# Patient Record
Sex: Female | Born: 1965
Health system: Southern US, Community
[De-identification: ages and names within clinical notes are randomized; demographics above are authoritative.]

## PROBLEM LIST (undated history)

## (undated) DIAGNOSIS — E785 Hyperlipidemia, unspecified: Secondary | ICD-10-CM

## (undated) DIAGNOSIS — E119 Type 2 diabetes mellitus without complications: Secondary | ICD-10-CM

## (undated) HISTORY — DX: Type 2 diabetes mellitus without complications: E11.9

## (undated) HISTORY — DX: Hyperlipidemia, unspecified: E78.5

---

## 1998-04-04 ENCOUNTER — Ambulatory Visit (HOSPITAL_COMMUNITY): Admission: RE | Admit: 1998-04-04 | Discharge: 1998-04-04 | Payer: Self-pay | Admitting: Obstetrics

## 1998-04-04 ENCOUNTER — Other Ambulatory Visit: Admission: RE | Admit: 1998-04-04 | Discharge: 1998-04-04 | Payer: Self-pay | Admitting: Obstetrics

## 1998-05-21 ENCOUNTER — Ambulatory Visit (HOSPITAL_COMMUNITY): Admission: RE | Admit: 1998-05-21 | Discharge: 1998-05-21 | Payer: Self-pay | Admitting: Obstetrics

## 1998-08-24 ENCOUNTER — Inpatient Hospital Stay (HOSPITAL_COMMUNITY): Admission: AD | Admit: 1998-08-24 | Discharge: 1998-08-26 | Payer: Self-pay | Admitting: *Deleted

## 1999-08-30 ENCOUNTER — Encounter: Payer: Self-pay | Admitting: General Surgery

## 1999-08-30 ENCOUNTER — Inpatient Hospital Stay (HOSPITAL_COMMUNITY): Admission: EM | Admit: 1999-08-30 | Discharge: 1999-08-31 | Payer: Self-pay

## 1999-08-30 ENCOUNTER — Encounter: Payer: Self-pay | Admitting: Gastroenterology

## 2017-03-26 DIAGNOSIS — H40003 Preglaucoma, unspecified, bilateral: Secondary | ICD-10-CM | POA: Insufficient documentation

## 2017-03-26 DIAGNOSIS — Q141 Congenital malformation of retina: Secondary | ICD-10-CM | POA: Insufficient documentation

## 2017-08-19 ENCOUNTER — Ambulatory Visit: Payer: Self-pay | Admitting: Family Medicine

## 2017-09-08 ENCOUNTER — Encounter: Payer: Self-pay | Admitting: Family Medicine

## 2017-09-08 ENCOUNTER — Ambulatory Visit (INDEPENDENT_AMBULATORY_CARE_PROVIDER_SITE_OTHER): Payer: Medicare Other | Admitting: Family Medicine

## 2017-09-08 VITALS — BP 122/87 | HR 107 | Temp 98.6°F | Ht 65.04 in | Wt 185.2 lb

## 2017-09-08 DIAGNOSIS — Z0000 Encounter for general adult medical examination without abnormal findings: Secondary | ICD-10-CM | POA: Insufficient documentation

## 2017-09-08 DIAGNOSIS — I10 Essential (primary) hypertension: Secondary | ICD-10-CM | POA: Diagnosis not present

## 2017-09-08 DIAGNOSIS — E119 Type 2 diabetes mellitus without complications: Secondary | ICD-10-CM | POA: Diagnosis not present

## 2017-09-08 DIAGNOSIS — L84 Corns and callosities: Secondary | ICD-10-CM | POA: Insufficient documentation

## 2017-09-08 LAB — POCT GLYCOSYLATED HEMOGLOBIN (HGB A1C): Hemoglobin A1C: 12.4 % — AB (ref 4.0–5.6)

## 2017-09-08 NOTE — Progress Notes (Signed)
Subjective: No chief complaint on file.    HPI: Linda Livingston is a 52 y.o. presenting to clinic today to discuss the following:  Establish Care Patient states she heard about our clinic from her sister who also goes here. She states she has not been to see a doctor in "I don't know how long" and has not had routine medical care "in many years". She states she was very rarely sick so did not seek medical help b/c she didn't feel bad enough to go anywhere to get any. She was last hospitalized over 10 years ago for "gallstones" although she states she did not have surgery.  Her only complaint is that she has a callous on the bottom of her left foot that hurts her sometimes when she walks.  She denies any changes in vision, dizziness, fever, chills, chest pain, SOB, nausea, vomiting, abdominal pain, changes in appetite, blood in the urine or stool.  She has no known medical problems, has not had any previous surgeries, takes no medications prescription or OTC, and has no known allergies.  Health Maintenance: HIV screen, discussed colonoscopy, pap smear, and mammogram. Will schedule at next visit as patient did not want to commit to doing them today.     ROS noted in HPI.   Past Medical, Surgical, Social, and Family History Reviewed & Updated per EMR.   Pertinent Historical Findings include:   Social History   Tobacco Use  Smoking Status Not on file   Objective: BP 122/87 (BP Location: Left Arm, Patient Position: Sitting, Cuff Size: Normal)   Pulse (!) 107   Temp 98.6 F (37 C) (Oral)   Ht 5' 5.04" (1.652 m)   Wt 185 lb 3.2 oz (84 kg)   SpO2 98%   BMI 30.78 kg/m  Vitals and nursing notes reviewed  Physical Exam Gen: Alert and Oriented x 3, NAD HEENT: Normocephalic, atraumatic, PERRLA, EOMI, TM visible with good light reflex, non-swollen, non-erythematous turbinates, non-erythematous pharyngeal mucosa, no exudates Neck: trachea midline, no thyroidmegaly, no LAD CV:  RRR, no murmurs, normal S1, S2 split, +2 pulses dorsalis pedis bilaterally Resp: CTAB, no wheezing, rales, or rhonchi, comfortable work of breathing Abd: obese, non-distended, non-tender, soft, +bs in all four quadrants, no hepatosplenomegaly MSK: Moves in all four extremities Ext: no clubbing, cyanosis, or edema Neuro: CN II-XII grossly intact, LE reflexes  Skin: warm, dry, intact, no rashes Psych: appropriate behavior, mood  Results for orders placed or performed in visit on 09/08/17 (from the past 72 hour(s))  HgB A1c     Status: Abnormal   Collection Time: 09/08/17  2:43 PM  Result Value Ref Range   Hemoglobin A1C 12.4 (A) 4.0 - 5.6 %   HbA1c POC (<> result, manual entry)  4.0 - 5.6 %   HbA1c, POC (prediabetic range)  5.7 - 6.4 %   HbA1c, POC (controlled diabetic range)  0.0 - 7.0 %    Assessment/Plan:  Health maintenance examination Patient has no systemic complaints or significant past medical history.   Will obtain routine blood work for maintenance: lipid panel, cbc, cmp, and HgbA1c to screen for hyperlipidemia, diabetes, anemia   Pre-ulcerative corn or callous Intact on bottom of left foot. Advised to wear shoes with good support and soak feet in warm soapy water and keep clean.   PATIENT EDUCATION PROVIDED: See AVS    Diagnosis and plan along with any newly prescribed medication(s) were discussed in detail with this patient today. The patient  verbalized understanding and agreed with the plan. Patient advised if symptoms worsen return to clinic or ER.   Health Maintainance:   Orders Placed This Encounter  Procedures  . CBC  . Comprehensive metabolic panel  . Lipid Panel  . HIV antibody (with reflex)  . HgB A1c    No orders of the defined types were placed in this encounter.    Harolyn Rutherford, DO 09/08/2017, 1:47 PM PGY-1, [place]

## 2017-09-08 NOTE — Assessment & Plan Note (Addendum)
Patient has no systemic complaints or significant past medical history.   Will obtain routine blood work for maintenance: lipid panel, cbc, cmp, and HgbA1c to screen for hyperlipidemia, diabetes, anemia

## 2017-09-08 NOTE — Assessment & Plan Note (Signed)
Intact on bottom of left foot. Advised to wear shoes with good support and soak feet in warm soapy water and keep clean.

## 2017-09-08 NOTE — Patient Instructions (Signed)
It was great to meet you today! Thank you for letting me participate in your care!  Today, we discussed your past medical history and your current health status. I am getting some routine lab/blood work on you today and will inform you of the results.   If everything is normal I will have you come back in 6 months. If it is not normal I will have you return sooner.  Please continue to eat a balanced diet and exercise.  Be well, Harolyn Rutherford, DO PGY-1, [facility]

## 2017-09-09 LAB — LIPID PANEL
Chol/HDL Ratio: 3.2 ratio (ref 0.0–4.4)
Cholesterol, Total: 201 mg/dL — ABNORMAL HIGH (ref 100–199)
HDL: 62 mg/dL (ref >39)
LDL Calculated: 121 mg/dL — ABNORMAL HIGH (ref 0–99)
Triglycerides: 90 mg/dL (ref 0–149)
VLDL Cholesterol Cal: 18 mg/dL (ref 5–40)

## 2017-09-09 LAB — COMPREHENSIVE METABOLIC PANEL
ALT: 11 IU/L (ref 0–32)
AST: 10 IU/L (ref 0–40)
Albumin/Globulin Ratio: 1.4 (ref 1.2–2.2)
Albumin: 3.9 g/dL (ref 3.5–5.5)
Alkaline Phosphatase: 100 IU/L (ref 39–117)
BUN/Creatinine Ratio: 13 (ref 9–23)
BUN: 10 mg/dL (ref 6–24)
Bilirubin Total: 0.3 mg/dL (ref 0.0–1.2)
CO2: 25 mmol/L (ref 20–29)
Calcium: 9.4 mg/dL (ref 8.7–10.2)
Chloride: 97 mmol/L (ref 96–106)
Creatinine, Ser: 0.78 mg/dL (ref 0.57–1.00)
GFR calc Af Amer: 101 mL/min/{1.73_m2} (ref >59)
GFR calc non Af Amer: 88 mL/min/{1.73_m2} (ref >59)
Globulin, Total: 2.8 g/dL (ref 1.5–4.5)
Glucose: 394 mg/dL — ABNORMAL HIGH (ref 65–99)
Potassium: 4.8 mmol/L (ref 3.5–5.2)
Sodium: 135 mmol/L (ref 134–144)
Total Protein: 6.7 g/dL (ref 6.0–8.5)

## 2017-09-09 LAB — CBC
Hematocrit: 43.2 % (ref 34.0–46.6)
Hemoglobin: 14.5 g/dL (ref 11.1–15.9)
MCH: 31.3 pg (ref 26.6–33.0)
MCHC: 33.6 g/dL (ref 31.5–35.7)
MCV: 93 fL (ref 79–97)
Platelets: 274 10*3/uL (ref 150–450)
RBC: 4.64 x10E6/uL (ref 3.77–5.28)
RDW: 13.8 % (ref 12.3–15.4)
WBC: 6.5 10*3/uL (ref 3.4–10.8)

## 2017-09-09 LAB — HIV ANTIBODY (ROUTINE TESTING W REFLEX): HIV Screen 4th Generation wRfx: NONREACTIVE

## 2017-09-30 ENCOUNTER — Ambulatory Visit (INDEPENDENT_AMBULATORY_CARE_PROVIDER_SITE_OTHER): Payer: Medicare Other | Admitting: Family Medicine

## 2017-09-30 ENCOUNTER — Encounter: Payer: Self-pay | Admitting: Family Medicine

## 2017-09-30 ENCOUNTER — Other Ambulatory Visit: Payer: Self-pay

## 2017-09-30 VITALS — BP 150/78 | HR 109 | Temp 98.7°F | Ht 65.0 in | Wt 186.0 lb

## 2017-09-30 DIAGNOSIS — E119 Type 2 diabetes mellitus without complications: Secondary | ICD-10-CM | POA: Diagnosis not present

## 2017-09-30 DIAGNOSIS — E7841 Elevated Lipoprotein(a): Secondary | ICD-10-CM | POA: Diagnosis not present

## 2017-09-30 DIAGNOSIS — R899 Unspecified abnormal finding in specimens from other organs, systems and tissues: Secondary | ICD-10-CM

## 2017-09-30 LAB — GLUCOSE, POCT (MANUAL RESULT ENTRY): POC Glucose: 334 mg/dl — AB (ref 70–99)

## 2017-09-30 MED ORDER — GLUCOSE BLOOD VI STRP: ORAL_STRIP | 12 refills | Status: DC

## 2017-09-30 MED ORDER — ONETOUCH DELICA LANCING DEV MISC: 1.0000 [IU] | Freq: Every day | 1 refills | Status: DC

## 2017-09-30 MED ORDER — PRAVASTATIN SODIUM 20 MG PO TABS: 20.0000 mg | ORAL_TABLET | Freq: Every day | ORAL | 3 refills | Status: DC

## 2017-09-30 MED ORDER — METFORMIN HCL 500 MG PO TABS
500.0000 mg | ORAL_TABLET | Freq: Two times a day (BID) | ORAL | 3 refills | Status: DC
Start: 2017-09-30 — End: 2017-12-02

## 2017-09-30 MED ORDER — ONETOUCH VERIO W/DEVICE KIT: 1.0000 [IU] | PACK | Freq: Every day | 0 refills | Status: DC

## 2017-09-30 MED ORDER — INSULIN GLARGINE 100 UNIT/ML SOLOSTAR PEN: 10.0000 [IU] | PEN_INJECTOR | Freq: Every day | SUBCUTANEOUS | 11 refills | Status: DC

## 2017-09-30 MED ORDER — ONETOUCH DELICA LANCETS 33G MISC: 1.0000 [IU] | Freq: Three times a day (TID) | 11 refills | Status: DC

## 2017-09-30 NOTE — Progress Notes (Signed)
Subjective: Chief Complaint  Patient presents with  . Diabetes    HPI: Linda Livingston is a 52 y.o. presenting to clinic today to discuss the following:  DM Follow up Patient is a newly diagnosed diabetic. We discussed her new diagnosis in depth and multiple options in dealing with her new diagnosis. She understands the health risks associated with diabetes as we talked about the reasons why it is important to control blood glucose. Patient will make diet modification and greatly reduce her intake of sweets and foods high in sugar. She will eliminate sodas from her diet. Patient will continue to exercise 30 minutes per day 5 times per week.  She denies any polydypsia, polyuria, polyphagia. She denies any chest pain, difficulty breathing, abdominal pain, nausea, vomiting, diarrhea, or blood in stool or urine.  HLD Patient has elevated Cholesterol and LDL from previous lab work done at last visit. Will start on statin.  Health Maintenance: none today     ROS noted in HPI.   Past Medical, Surgical, Social, and Family History Reviewed & Updated per EMR.   Pertinent Historical Findings include:   Social History   Tobacco Use  Smoking Status Never Smoker  Smokeless Tobacco Never Used    Objective: BP (!) 150/78   Pulse (!) 109   Temp 98.7 F (37.1 C) (Oral)   Ht _0  (1.651 m)   Wt 186 lb (84.4 kg)   SpO2 97%   BMI 30.95 kg/m  Vitals and nursing notes reviewed  Physical Exam Gen: Alert and Oriented x 3, NAD HEENT: Normocephalic, atraumatic, PERRLA, EOMI Neck: trachea midline, no thyroidmegaly, no LAD CV: RRR, no murmurs, normal S1, S2 split, +2 pulses dorsalis pedis bilaterally Resp: CTAB, no wheezing, rales, or rhonchi, comfortable work of breathing Abd: non-distended, non-tender, soft, +bs in all four quadrants, no hepatosplenomegaly MSK: FROM in all four extremities Ext: no clubbing, cyanosis, or edema Skin: warm, dry, intact, no rashes Psych: appropriate  behavior, mood  No results found for this or any previous visit (from the past 72 hour(s)).  Assessment/Plan:  Elevated lipoprotein(a) Started on Pravastatin 64m. Will recheck in 6 months and also recheck LFTs at that time.  Type 2 diabetes mellitus without complication, without long-term current use of insulin ([place] Patient will attempt to get control with diet and exercise for the next 2 months. Started Metformin 5014mBID for now. Will titrate up at next visit. If her A1c remains elevated with little improvement I will add a second agent at her next visit; GLP-1 or a SGLT-2. If she remains elevated after addition of a second oral agent I will start her on insulin.   Patient was given multiple options and agreed with this plan.   PATIENT EDUCATION PROVIDED: See AVS    Diagnosis and plan along with any newly prescribed medication(s) were discussed in detail with this patient today. The patient verbalized understanding and agreed with the plan. Patient advised if symptoms worsen return to clinic or ER.   Health Maintainance:   Orders Placed This Encounter  Procedures  . Glucose (CBG)    Meds ordered this encounter  Medications  . DISCONTD: Insulin Glargine (LANTUS) 100 UNIT/ML Solostar Pen    Sig: Inject 10 Units into the skin at bedtime.    Dispense:  15 mL    Refill:  11  . metFORMIN (GLUCOPHAGE) 500 MG tablet    Sig: Take 1 tablet (500 mg total) by mouth 2 (two) times daily with  a meal.    Dispense:  180 tablet    Refill:  3  . glucose blood (ONETOUCH VERIO) test strip    Sig: Use as instructed    Dispense:  100 each    Refill:  12  . ONETOUCH DELICA LANCETS 33O MISC    Sig: 1 Units by Does not apply route 4 (four) times daily -  before meals and at bedtime.    Dispense:  120 each    Refill:  11  . Lancet Devices (ONE TOUCH DELICA LANCING DEV) MISC    Sig: 1 Units by Does not apply route daily.    Dispense:  1 each    Refill:  1  . Blood Glucose Monitoring Suppl  (ONETOUCH VERIO) w/Device KIT    Sig: 1 Units by Does not apply route daily.    Dispense:  1 kit    Refill:  0  . pravastatin (PRAVACHOL) 20 MG tablet    Sig: Take 1 tablet (20 mg total) by mouth daily.    Dispense:  90 tablet    Refill:  [place], DO 09/30/2017, 10:09 AM PGY-2 [place]

## 2017-09-30 NOTE — Patient Instructions (Addendum)
It was great to see you today! Thank you for letting me participate in your care!  Today, we discussed your new diagnosis of diabetes. I have started you on a medications. One is a pill and I would like you to take it 2 times daily. Please check your blood sugar before you eat every morning and record it. Bring that record with you at your next visit.  Please eliminate all sodas and sugary drinks from your diet, water is best but you can have diet sodas as they do not contain sugar. Please avoid sweets, chocolate, candy, and eating foods that contain a lot of carbohydrates.   I will see you back in two months to ensure you are doing well. Please remember record your blood sugar for each morning and bring it with you to your next appointment.  Be well, Linda Rutherford, DO PGY-2, [facility]

## 2017-10-05 DIAGNOSIS — E119 Type 2 diabetes mellitus without complications: Secondary | ICD-10-CM | POA: Insufficient documentation

## 2017-10-05 DIAGNOSIS — E7841 Elevated Lipoprotein(a): Secondary | ICD-10-CM | POA: Insufficient documentation

## 2017-10-05 NOTE — Assessment & Plan Note (Signed)
Started on Pravastatin 20mg . Will recheck in 6 months and also recheck LFTs at that time.

## 2017-10-05 NOTE — Assessment & Plan Note (Signed)
Patient will attempt to get control with diet and exercise for the next 2 months. Started Metformin 500mg  BID for now. Will titrate up at next visit. If her A1c remains elevated with little improvement I will add a second agent at her next visit; GLP-1 or a SGLT-2. If she remains elevated after addition of a second oral agent I will start her on insulin.   Patient was given multiple options and agreed with this plan.

## 2017-10-14 ENCOUNTER — Encounter

## 2017-10-26 ENCOUNTER — Other Ambulatory Visit: Payer: Self-pay | Admitting: Family Medicine

## 2017-10-27 ENCOUNTER — Encounter: Payer: Medicare Other | Attending: Family Medicine | Admitting: Dietician

## 2017-10-27 DIAGNOSIS — E119 Type 2 diabetes mellitus without complications: Secondary | ICD-10-CM | POA: Diagnosis not present

## 2017-10-27 DIAGNOSIS — Z713 Dietary counseling and surveillance: Secondary | ICD-10-CM | POA: Insufficient documentation

## 2017-10-28 ENCOUNTER — Encounter: Payer: Self-pay | Admitting: Dietician

## 2017-10-28 NOTE — Progress Notes (Signed)
Patient was seen on 10/27/17 for the first of a series of three diabetes self-management courses at the [facility].  Patient Education Plan per assessed needs and concerns is to attend three course education program for Diabetes Self Management Education.  The following learning objectives were met by the patient during this class:  Describe diabetes  State some common risk factors for diabetes  Defines the role of glucose and insulin  Identifies type of diabetes and pathophysiology  Describe the relationship between diabetes and cardiovascular risk  State the members of the Healthcare Team  States the rationale for glucose monitoring  State when to test glucose  State their individual Target Range  State the importance of logging glucose readings  Describe how to interpret glucose readings  Identifies A1C target  Explain the correlation between A1c and eAG values  State symptoms and treatment of high blood glucose  State symptoms and treatment of low blood glucose  Explain proper technique for glucose testing  Identifies proper sharps disposal  Handouts given during class include:  ADA Diabetes You Take Control   Carb Counting and Meal Planning book  Meal Plan Card  Meal planning worksheet  Low Sodium Flavoring Tips  Types of Fats  The diabetes portion plate  A1c to eAG Conversion Chart  Diabetes Recommended Care Schedule  Support Group  Diabetes Success Plan  Core Class Satisfaction Survey   Follow-Up Plan:  Attend core 2   

## 2017-11-03 ENCOUNTER — Encounter: Payer: Self-pay | Admitting: Dietician

## 2017-11-03 ENCOUNTER — Encounter: Payer: Medicare Other | Admitting: Dietician

## 2017-11-03 DIAGNOSIS — Z713 Dietary counseling and surveillance: Secondary | ICD-10-CM | POA: Diagnosis not present

## 2017-11-03 DIAGNOSIS — E119 Type 2 diabetes mellitus without complications: Secondary | ICD-10-CM | POA: Diagnosis not present

## 2017-11-03 NOTE — Progress Notes (Signed)
Patient was seen on 11/03/17 for the second of a series of three diabetes self-management courses at the [facility]. The following learning objectives were met by the patient during this class:   Describe the role of different macronutrients on glucose  Explain how carbohydrates affect blood glucose  State what foods contain the most carbohydrates  Demonstrate carbohydrate counting  Demonstrate how to read Nutrition Facts food label  Describe effects of various fats on heart health  Describe the importance of good nutrition for health and healthy eating strategies  Describe techniques for managing your shopping, cooking and meal planning  List strategies to follow meal plan when dining out  Describe the effects of alcohol on glucose and how to use it safely  Goals:  Follow Diabetes Meal Plan as instructed  Aim to spread carbs evenly throughout the day  Aim for 3 meals per day and snacks as needed Include lean protein foods to meals/snacks  Monitor glucose levels as instructed by your doctor   Follow-Up Plan:  Attend Core 3  Work towards following your personal food plan.    

## 2017-11-10 ENCOUNTER — Encounter: Payer: Self-pay | Admitting: Dietician

## 2017-11-10 ENCOUNTER — Encounter: Payer: Medicare Other | Admitting: Dietician

## 2017-11-10 DIAGNOSIS — Z713 Dietary counseling and surveillance: Secondary | ICD-10-CM | POA: Diagnosis not present

## 2017-11-10 DIAGNOSIS — E119 Type 2 diabetes mellitus without complications: Secondary | ICD-10-CM

## 2017-11-10 NOTE — Progress Notes (Signed)
Patient was seen on 11/10/17 for the third of a series of three diabetes self-management courses at the [facility].   Linda Livingston the amount of activity recommended for healthy living . Describe activities suitable for individual needs . Identify ways to regularly incorporate activity into daily life . Identify barriers to activity and ways to over come these barriers  Identify diabetes medications being personally used and their primary action for lowering glucose and possible side effects . Describe role of stress on blood glucose and develop strategies to address psychosocial issues . Identify diabetes complications and ways to prevent them  Explain how to manage diabetes during illness . Evaluate success in meeting personal goal . Establish 2-3 goals that they will plan to diligently work on  Goals:   I will count my carb choices at most meals and snacks  I will be active 30 minutes or more 2 times a week  I will take my diabetes medications as scheduled  To help manage stress I will  walk at least 2 times a week  Your patient has identified these potential barriers to change:  Motivation Finances Stress Lack of Family Support  Your patient has identified their diabetes self-care support plan as  Family Forensic scientist Resources  [facility] Support Group   American Diabetes Association Website    Plan:  Attend Support Group as desired

## 2017-12-02 ENCOUNTER — Ambulatory Visit (INDEPENDENT_AMBULATORY_CARE_PROVIDER_SITE_OTHER): Payer: Medicare Other | Admitting: Family Medicine

## 2017-12-02 ENCOUNTER — Other Ambulatory Visit: Payer: Self-pay

## 2017-12-02 ENCOUNTER — Encounter: Payer: Self-pay | Admitting: Family Medicine

## 2017-12-02 VITALS — BP 112/68 | HR 99 | Temp 98.3°F | Ht 65.0 in | Wt 176.8 lb

## 2017-12-02 DIAGNOSIS — E7841 Elevated Lipoprotein(a): Secondary | ICD-10-CM | POA: Diagnosis not present

## 2017-12-02 DIAGNOSIS — Z23 Encounter for immunization: Secondary | ICD-10-CM | POA: Diagnosis not present

## 2017-12-02 DIAGNOSIS — E119 Type 2 diabetes mellitus without complications: Secondary | ICD-10-CM | POA: Diagnosis not present

## 2017-12-02 DIAGNOSIS — E785 Hyperlipidemia, unspecified: Secondary | ICD-10-CM | POA: Insufficient documentation

## 2017-12-02 LAB — POCT GLYCOSYLATED HEMOGLOBIN (HGB A1C): HBA1C, POC (CONTROLLED DIABETIC RANGE): 7.5 % — AB (ref 0.0–7.0)

## 2017-12-02 MED ORDER — METFORMIN HCL 500 MG PO TABS
1000.0000 mg | ORAL_TABLET | Freq: Two times a day (BID) | ORAL | 3 refills | Status: DC
Start: 2017-12-02 — End: 2019-01-19

## 2017-12-02 NOTE — Assessment & Plan Note (Signed)
Will recheck lipid panel at next visit in 3 months. If still elevated increase pravastatin to 40mg .

## 2017-12-02 NOTE — Progress Notes (Signed)
     Subjective: Chief Complaint  Patient presents with  . Follow-up    dm     HPI: Linda Livingston is a 52 y.o. presenting to clinic today to discuss the following:  DM Patient has attended Diabetic Education courses as instructed and states they were helpful. She states she has been avoiding candy, sweets, and sugary drinks and has been watching her carbohydrate intake. Her A1c today is 7.5 down from 12.4 3 months ago. She is on Metformin 500mg  BID. She has some slight diarrhea with metformin if she doesn't eat   Hyperlipidemia Elevated LDL at last check but is taking pravastatin 20mg . Will recheck in 3 months time and if still elevated will increase dose. No side effects reported at this time.  Health Maintenance: Influenza vaccine, PNA vaccine     ROS noted in HPI.   Past Medical, Surgical, Social, and Family History Reviewed & Updated per EMR.   Pertinent Historical Findings include:   Social History   Tobacco Use  Smoking Status Current Some Day Smoker  Smokeless Tobacco Never Used    Objective: BP 112/68   Pulse 99   Temp 98.3 F (36.8 C) (Oral)   Ht 5\' 5"  (1.651 m)   Wt 176 lb 12.8 oz (80.2 kg)   SpO2 98%   BMI 29.42 kg/m  Vitals and nursing notes reviewed  Physical Exam Gen: Alert and Oriented x 3, NAD HEENT: Normocephalic, atraumatic Neck: trachea midline, no thyroidmegaly, no LAD CV: RRR, no murmurs, normal S1, S2 split Resp: CTAB, no wheezing, rales, or rhonchi, comfortable work of breathing MSK: Moves all four extremities Ext: no clubbing, cyanosis, or edema; diabetic foot exam performed and documented in quality metrics Skin: warm, dry, intact, no rashes   Results for orders placed or performed in visit on 12/02/17 (from the past 72 hour(s))  HgB A1c     Status: Abnormal   Collection Time: 12/02/17 10:06 AM  Result Value Ref Range   Hemoglobin A1C     HbA1c POC (<> result, manual entry)     HbA1c, POC (prediabetic range)     HbA1c, POC  (controlled diabetic range) 7.5 (A) 0.0 - 7.0 %    Assessment/Plan:  Type 2 diabetes mellitus without complication, without long-term current use of insulin (HCC) [name] greatly improved from last check 3 months ago with diet, exercise, and metformin although not to goal. I have increased Metformin to 1000mg  BID.   Continue diet and exercise.  Follow up in 3 months.   Hyperlipidemia Will recheck lipid panel at next visit in 3 months. If still elevated increase pravastatin to 40mg .   PATIENT EDUCATION PROVIDED: See AVS    Diagnosis and plan along with any newly prescribed medication(s) were discussed in detail with this patient today. The patient verbalized understanding and agreed with the plan. Patient advised if symptoms worsen return to clinic or ER.   Health Maintainance:   Orders Placed This Encounter  Procedures  . HgB A1c    Meds ordered this encounter  Medications  . metFORMIN (GLUCOPHAGE) 500 MG tablet    Sig: Take 2 tablets (1,000 mg total) by mouth 2 (two) times daily with a meal.    Dispense:  180 tablet    Refill:  [place], DO 12/02/2017, 10:27 AM PGY-2 [place]

## 2017-12-02 NOTE — Assessment & Plan Note (Addendum)
[name] greatly improved from last check 3 months ago with diet, exercise, and metformin although not to goal. I have increased Metformin to 1000mg  BID.   Continue diet and exercise.  Follow up in 3 months.

## 2017-12-02 NOTE — Patient Instructions (Signed)
It was great to see you today! Thank you for letting me participate in your care!  Today, we discussed your diabetes. You are doing GREAT!!!  Keep up the good work!! I have increased your Metformin dose. Please take 2 500mg  pills twice a day until you run out. I have sent the new prescription for 1000mg  of Metformin twice a day to the pharmacy and you can pick it up when you need it.  Please return in 3 months. I am so happy for you that you have made these positive changes in your life. Please let me know if I can do anything else to help you.  Be well, Harolyn Rutherford, DO PGY-2, [facility]

## 2018-01-11 DIAGNOSIS — D3131 Benign neoplasm of right choroid: Secondary | ICD-10-CM | POA: Diagnosis not present

## 2018-01-11 DIAGNOSIS — H04123 Dry eye syndrome of bilateral lacrimal glands: Secondary | ICD-10-CM | POA: Diagnosis not present

## 2018-01-11 DIAGNOSIS — H40013 Open angle with borderline findings, low risk, bilateral: Secondary | ICD-10-CM | POA: Diagnosis not present

## 2018-01-11 DIAGNOSIS — H2513 Age-related nuclear cataract, bilateral: Secondary | ICD-10-CM | POA: Diagnosis not present

## 2018-01-12 DIAGNOSIS — H5213 Myopia, bilateral: Secondary | ICD-10-CM | POA: Diagnosis not present

## 2018-01-21 ENCOUNTER — Ambulatory Visit (INDEPENDENT_AMBULATORY_CARE_PROVIDER_SITE_OTHER): Payer: Medicare Other | Admitting: Family Medicine

## 2018-01-21 VITALS — BP 110/80 | HR 105 | Temp 98.2°F | Wt 173.0 lb

## 2018-01-21 DIAGNOSIS — Z0000 Encounter for general adult medical examination without abnormal findings: Secondary | ICD-10-CM

## 2018-01-21 DIAGNOSIS — M2011 Hallux valgus (acquired), right foot: Secondary | ICD-10-CM | POA: Diagnosis not present

## 2018-01-21 DIAGNOSIS — L84 Corns and callosities: Secondary | ICD-10-CM | POA: Insufficient documentation

## 2018-01-21 DIAGNOSIS — M2012 Hallux valgus (acquired), left foot: Secondary | ICD-10-CM | POA: Insufficient documentation

## 2018-01-21 NOTE — Progress Notes (Signed)
     Subjective:  HPI: Linda Livingston is a 52 y.o. presenting to clinic today to discuss the following:  Painful spot on bottom of right foot Patient presents today for a painful, hardened, thickened area on the bottom of her foot that has been painful and noticeable over the past 3 months. Patient is known diabetic with good glycemic control with diet, exercises, and Metformin. She denies any numbness, tingling, loss of feeling, or feet or area becoming cold. No fever or chills.  Bilateral Hallux Valgus Patient has bilateral bunions of the great toe. She has had these for some time and they do not hurt her but she does not like the way they look. She thinks they have gotten slightly worse over the past couple of years. She presents today to discuss options for treatment.  Health Maintenance: Paperwork for screening mammogram and colonoscopy given today    ROS noted in HPI.   Past Medical, Surgical, Social, and Family History Reviewed & Updated per EMR.   Pertinent Historical Findings include:   Social History   Tobacco Use  Smoking Status Current Some Day Smoker  Smokeless Tobacco Never Used   Objective: BP 110/80   Pulse (!) 105   Temp 98.2 F (36.8 C) (Oral)   Wt 173 lb (78.5 kg)   SpO2 96%   BMI 28.79 kg/m  Vitals and nursing notes reviewed  Physical Exam Gen: Alert and Oriented x 3, NAD HEENT: Normocephalic, atraumatic Ext: no clubbing, cyanosis, or edema; sensation intact in both feet bilaterally +2 dorsalis pedis pulses and posterior tibialis pulses bilaterally Neuro: No gross deficits Skin: warm, dry, intact, no rashes       No results found for this or any previous visit (from the past 72 hour(s)).  Assessment/Plan:  Valgus deformity of both great toes Referral to Podiatry for consideration of surgery vs orthotics  Callus of foot No red flags on exam today. No signs of neuorovascular compromise. Given location will refer debridement to podiatry. -  Referral to podiatry for debridement of painful callus - Patient also instructed to request diabetic footwear to prevent recurrence.    PATIENT EDUCATION PROVIDED: See AVS    Diagnosis and plan along with any newly prescribed medication(s) were discussed in detail with this patient today. The patient verbalized understanding and agreed with the plan. Patient advised if symptoms worsen return to clinic or ER.   Health Maintainance: Paperwork given for mammogram and colonoscopy   Orders Placed This Encounter  Procedures  . Ambulatory referral to Podiatry    Referral Priority:   Routine    Referral Type:   Consultation    Referral Reason:   Specialty Services Required    Requested Specialty:   Podiatry    Number of Visits Requested:   1    No orders of the defined types were placed in this encounter.    Harolyn Rutherford, DO 01/21/2018, 2:33 PM PGY-2 [place]

## 2018-01-21 NOTE — Assessment & Plan Note (Signed)
No red flags on exam today. No signs of neuorovascular compromise. Given location will refer debridement to podiatry. - Referral to podiatry for debridement of painful callus - Patient also instructed to request diabetic footwear to prevent recurrence.

## 2018-01-21 NOTE — Assessment & Plan Note (Signed)
Referral to Podiatry for consideration of surgery vs orthotics

## 2018-01-21 NOTE — Patient Instructions (Signed)
It was great to see you today! Thank you for letting me participate in your care!  Today, we discussed the painful callus on the bottom of your right foot. I recommend you see a Podiatrist to have the callus debrided and removed. They can also recommend you get diabetic shoes and address your bunions.  Please see me in one month for follow up for your diabetes. You are doing so well controlling your blood sugar, keep it up!  Be well, Harolyn Rutherford, DO PGY-2, [facility]

## 2018-02-16 DIAGNOSIS — H2513 Age-related nuclear cataract, bilateral: Secondary | ICD-10-CM | POA: Diagnosis not present

## 2018-02-16 DIAGNOSIS — H40013 Open angle with borderline findings, low risk, bilateral: Secondary | ICD-10-CM | POA: Diagnosis not present

## 2018-02-16 DIAGNOSIS — D3131 Benign neoplasm of right choroid: Secondary | ICD-10-CM | POA: Diagnosis not present

## 2018-02-16 DIAGNOSIS — H04123 Dry eye syndrome of bilateral lacrimal glands: Secondary | ICD-10-CM | POA: Diagnosis not present

## 2018-02-19 ENCOUNTER — Encounter: Payer: Self-pay | Admitting: Podiatry

## 2018-02-19 ENCOUNTER — Ambulatory Visit (INDEPENDENT_AMBULATORY_CARE_PROVIDER_SITE_OTHER): Payer: Medicare Other | Admitting: Podiatry

## 2018-02-19 VITALS — BP 149/89

## 2018-02-19 DIAGNOSIS — M79675 Pain in left toe(s): Secondary | ICD-10-CM | POA: Diagnosis not present

## 2018-02-19 DIAGNOSIS — B351 Tinea unguium: Secondary | ICD-10-CM

## 2018-02-19 DIAGNOSIS — M79674 Pain in right toe(s): Secondary | ICD-10-CM | POA: Diagnosis not present

## 2018-02-19 DIAGNOSIS — L84 Corns and callosities: Secondary | ICD-10-CM

## 2018-02-19 DIAGNOSIS — E119 Type 2 diabetes mellitus without complications: Secondary | ICD-10-CM

## 2018-02-19 NOTE — Patient Instructions (Signed)

## 2018-03-05 ENCOUNTER — Encounter: Payer: Self-pay | Admitting: Family Medicine

## 2018-03-05 ENCOUNTER — Ambulatory Visit (INDEPENDENT_AMBULATORY_CARE_PROVIDER_SITE_OTHER): Payer: Medicare Other | Admitting: Family Medicine

## 2018-03-05 ENCOUNTER — Other Ambulatory Visit: Payer: Self-pay

## 2018-03-05 VITALS — BP 122/78 | HR 93 | Temp 98.3°F | Ht 65.0 in | Wt 168.6 lb

## 2018-03-05 DIAGNOSIS — E119 Type 2 diabetes mellitus without complications: Secondary | ICD-10-CM | POA: Diagnosis not present

## 2018-03-05 DIAGNOSIS — E7841 Elevated Lipoprotein(a): Secondary | ICD-10-CM | POA: Diagnosis not present

## 2018-03-05 LAB — POCT GLYCOSYLATED HEMOGLOBIN (HGB A1C): HBA1C, POC (CONTROLLED DIABETIC RANGE): 6 % (ref 0.0–7.0)

## 2018-03-05 NOTE — Patient Instructions (Signed)
It was great to see you today! Thank you for letting me participate in your care!  Today, we discussed your diabetes and I continue to be so impressed that you are doing so well. Keep up the good work and I will see you in 6 months for a regular check up.   Linda Livingston and have a Happy New Year!!!  Be well, Linda Rutherford, DO PGY-2, [facility]

## 2018-03-05 NOTE — Progress Notes (Signed)
     Subjective: No chief complaint on file.  HPI: Linda Livingston is a 52 y.o. presenting to clinic today to discuss the following:  DM Patient presents today for regular follow up for her T2DM. She was newly diagnosed about 6 months ago and has been doing incredibly well controlling her blood sugar with diet modification and exercise. She is taking metformin. She is doing well and has no complaints this afternoon. She denies any complications from her diabetes such as feet numbness or burning, no vision changes, no urinary frequency or increased urinary urge. No pain or cramping in her legs while walking.  Health Maintenance: urine microalbumin/creatnine ration, opthamology referral for yearly retinal exam     ROS noted in HPI.   Past Medical, Surgical, Social, and Family History Reviewed & Updated per EMR.   Pertinent Historical Findings include:   Social History   Tobacco Use  Smoking Status Current Some Day Smoker  Smokeless Tobacco Never Used    Objective: BP 122/78   Pulse 93   Temp 98.3 F (36.8 C) (Oral)   Ht 5\' 5"  (1.651 m)   Wt 168 lb 9.6 oz (76.5 kg)   SpO2 98%   BMI 28.06 kg/m  Vitals and nursing notes reviewed  Physical Exam Gen: Alert and Oriented x 3, NAD HEENT: Normocephalic, atraumatic CV: RRR, no murmurs, normal S1, S2 split Resp: CTAB, no wheezing, rales, or rhonchi, comfortable work of breathing Ext: no clubbing, cyanosis, or edema Neuro: No gross deficits Skin: warm, dry, intact, no rashes  No results found for this or any previous visit (from the past 72 hour(s)).  Assessment/Plan:  Type 2 diabetes mellitus without complication, without long-term current use of insulin ([place]) Patient continues to manage her blood glucose extremely well with diet, exercise, and metformin. Given her terrific compliance and her response to diet and exercise I will move her next appointment to 6 months.  - Cont Metformin 1000mg  BID daily - Cont diet and  exercise and avoiding foods high in fat and sugar   PATIENT EDUCATION PROVIDED: See AVS    Diagnosis and plan along with any newly prescribed medication(s) were discussed in detail with this patient today. The patient verbalized understanding and agreed with the plan. Patient advised if symptoms worsen return to clinic or ER.   Health Maintainance: urine creatnine   Orders Placed This Encounter  Procedures  . Lipid Panel  . Microalbumin/Creatinine Ratio, Urine  . Ambulatory referral to Ophthalmology    Referral Priority:   Routine    Referral Type:   Consultation    Referral Reason:   Specialty Services Required    Requested Specialty:   Ophthalmology    Number of Visits Requested:   1  . HgB A1c    No orders of the defined types were placed in this encounter.    Harolyn Rutherford, DO 03/05/2018, 1:32 PM PGY-2 [place]

## 2018-03-06 LAB — LIPID PANEL
CHOL/HDL RATIO: 3 ratio (ref 0.0–4.4)
Cholesterol, Total: 143 mg/dL (ref 100–199)
HDL: 48 mg/dL (ref >39)
LDL Calculated: 82 mg/dL (ref 0–99)
Triglycerides: 66 mg/dL (ref 0–149)
VLDL Cholesterol Cal: 13 mg/dL (ref 5–40)

## 2018-03-06 LAB — MICROALBUMIN / CREATININE URINE RATIO
CREATININE, UR: 148.9 mg/dL
MICROALB/CREAT RATIO: 3.2 mg/g{creat} (ref 0.0–30.0)
MICROALBUM., U, RANDOM: 4.7 ug/mL

## 2018-03-12 NOTE — Assessment & Plan Note (Signed)
Patient continues to manage her blood glucose extremely well with diet, exercise, and metformin. Given her terrific compliance and her response to diet and exercise I will move her next appointment to 6 months.  - Cont Metformin 1000mg  BID daily - Cont diet and exercise and avoiding foods high in fat and sugar

## 2018-03-23 ENCOUNTER — Encounter: Payer: Self-pay | Admitting: Podiatry

## 2018-03-23 NOTE — Progress Notes (Signed)
Subjective: Linda Livingston presents today referred by [name], DO with diabetes and cc of painful, calluses which interfere with daily activities.  Pain is aggravated when weightbearing with and without wearing enclosed shoe gear.   Patient states she is nervous because she doesn't know what to expect during today's visit.  She denies any history of foot wounds.  She denies any numbness, tingling, burning sensations or cramping in her feet.  Past Medical History:  Diagnosis Date  . Diabetes mellitus without complication [facility])     Patient Active Problem List   Diagnosis Date Noted  . Callus of foot 01/21/2018  . Valgus deformity of both great toes 01/21/2018  . Need for immunization against influenza 12/02/2017  . Hyperlipidemia 12/02/2017  . Type 2 diabetes mellitus without complication, without long-term current use of insulin ([place]) 10/05/2017  . Elevated lipoprotein(a) 10/05/2017  . Health maintenance examination 09/08/2017  . Pre-ulcerative corn or callous 09/08/2017  . Glaucoma suspect of both eyes 03/26/2017  . Congenital hypertrophy of retinal pigment epithelium 03/26/2017    No past surgical history on file.   Current Outpatient Medications:  .  Blood Glucose Monitoring Suppl (ONETOUCH VERIO) w/Device KIT, 1 Units by Does not apply route daily., Disp: 1 kit, Rfl: 0 .  glucose blood (ONETOUCH VERIO) test strip, Use as instructed, Disp: 100 each, Rfl: 12 .  Lancet Devices (ONE TOUCH DELICA LANCING DEV) MISC, 1 Units by Does not apply route daily., Disp: 1 each, Rfl: 1 .  latanoprost (XALATAN) 0.005 % ophthalmic solution, INT 1 GTT IN OU HS, Disp: , Rfl: 6 .  metFORMIN (GLUCOPHAGE) 500 MG tablet, Take 2 tablets (1,000 mg total) by mouth 2 (two) times daily with a meal., Disp: 180 tablet, Rfl: 3 .  pravastatin (PRAVACHOL) 20 MG tablet, Take 1 tablet (20 mg total) by mouth daily., Disp: 90 tablet, Rfl: 3 .  ONETOUCH DELICA LANCETS 18A MISC, 1 Units by Does not apply  route 4 (four) times daily -  before meals and at bedtime., Disp: 120 each, Rfl: 11  No Known Allergies  Social History   Occupational History  . Not on file  Tobacco Use  . Smoking status: Current Some Day Smoker  . Smokeless tobacco: Never Used  Substance and Sexual Activity  . Alcohol use: Yes    Alcohol/week: 1.0 standard drinks    Types: 1 Cans of beer per week    Comment: occas  . Drug use: Never  . Sexual activity: Not on file    No family history on file.  Immunization History  Administered Date(s) Administered  . Influenza,inj,Quad PF,6+ Mos 12/02/2017  . Pneumococcal Polysaccharide-23 12/02/2017     Review of systems: Positive Findings in bold print.  Constitutional:  chills, fatigue, fever, sweats, weight change Communication: Optometrist, sign Ecologist, hand writing, iPad/Android device Eyes: diplopia, glare,  light sensitivity, eyeglasses, blindness Ears nose mouth throat: Hard of hearing, deaf, sign language,  vertigo,   bloody nose,  rhinitis,  cold sores, snoring Cardiovascular: HTN, edema, arrhythmia, pacemaker in place, defibrillator in place,  chest pain/tightness, chronic anticoagulation, blood clot Respiratory:  difficulty breathing, denies congestion, SOB, wheezing, cough Gastrointestinal: abdominal pain, diarrhea, nausea, vomiting,  Genitourinary:  nocturia,  pain on urination,  blood in urine, Foley catheter, urinary urgency Musculoskeletal: Uses mobility aid,  cramping, stiff joints, painful joints,  Skin: +changes in toenails, calluses right foot,  color change dryness, itchy skin, mole changes, or rash  Neurological: numbness, paresthesias, burning in feet, denies  fainting,  seizure, change in speech. denies headaches, memory problems/poor historian, cerebral palsy Endocrine: diabetes, hypothyroidism, hyperthyroidism,  dry mouth, flushing, denies heat intolerance,  cold intolerance,  excessive thirst, denies polyuria,   nocturia Hematological:  easy bleeding,  excessive bleeding, easy bruising, enlarged lymph nodes, on long term blood thinner Allergy/immunological:  hive, frequent infections, multiple drug allergies, seasonal allergies,  Psychiatric:  anxiety, depression, mood disorder, suicidal ideations, hallucinations   Objective: Vascular Examination: Capillary refill time immediate x 10 digits Dorsalis pedis and posterior tibial pulses present b/l No digital hair x 10 digits Skin temperature gradient WNL b/l  Dermatological Examination: Skin with normal turgor, texture and tone b/l  Toenails 1-5 b/l discolored, thick, dystrophic with subungual debris and pain with palpation to nailbeds due to thickness of nails.  Hyperkeratotic lesions noted submetatarsal head 2 right foot, subhallux IPJ right foot, distal tip of right 2nd digit. No erythema, no edema, no drainage, no flocculence noted to either lesion. +tenderness to palpation.  Musculoskeletal: Muscle strength 5/5 to all LE muscle groups  HAV with bunion b/l  Claw toe deformity right 2nd digit  Neurological: Sensation intact with 10 gram monofilament  Vibratory sensation intact.  Assessment: 1. Painful onychomycosis toenails 1-5 b/l  2. Painful calluses submetatarsal head 2 right foot, subhallux IPJ right foot, distal tip of right 2nd digit 3. NIDDM  Plan: 1. Discussed diabetic foot care principles. Literature dispensed on today. 2. Toenails 1-5 b/l were debrided in length and girth without iatrogenic bleeding. 3. Calluses pared submetatarsal head 2 right foot, subhallux IPJ right foot, distal tip of right 2nd digit 4. Patient to continue soft, supportive shoe gear. Will check benefits for diabetic shoes or accommodative orthotics to offload pressure areas on her feet. 5. Patient to report any pedal injuries to medical professional immediately. 6. Follow up 3 months.  7. Patient/POA to call should there be a concern in the  interim.

## 2018-04-23 DIAGNOSIS — H40013 Open angle with borderline findings, low risk, bilateral: Secondary | ICD-10-CM | POA: Diagnosis not present

## 2018-05-21 ENCOUNTER — Other Ambulatory Visit: Payer: Self-pay | Admitting: Family Medicine

## 2018-05-21 ENCOUNTER — Ambulatory Visit (INDEPENDENT_AMBULATORY_CARE_PROVIDER_SITE_OTHER): Payer: Medicare Other | Admitting: Podiatry

## 2018-05-21 ENCOUNTER — Telehealth: Payer: Self-pay | Admitting: Family Medicine

## 2018-05-21 DIAGNOSIS — E119 Type 2 diabetes mellitus without complications: Secondary | ICD-10-CM | POA: Diagnosis not present

## 2018-05-21 DIAGNOSIS — M79674 Pain in right toe(s): Secondary | ICD-10-CM | POA: Diagnosis not present

## 2018-05-21 DIAGNOSIS — L84 Corns and callosities: Secondary | ICD-10-CM

## 2018-05-21 DIAGNOSIS — B351 Tinea unguium: Secondary | ICD-10-CM

## 2018-05-21 DIAGNOSIS — M79675 Pain in left toe(s): Secondary | ICD-10-CM

## 2018-05-21 MED ORDER — ONETOUCH DELICA LANCETS 33G MISC: 1.0000 [IU] | Freq: Three times a day (TID) | 11 refills | Status: DC

## 2018-05-21 NOTE — Progress Notes (Signed)
Refill per patient request.

## 2018-05-21 NOTE — Patient Instructions (Signed)
Diabetes Mellitus and Foot Care Foot care is an important part of your health, especially when you have diabetes. Diabetes may cause you to have problems because of poor blood flow (circulation) to your feet and legs, which can cause your skin to:  Become thinner and drier.  Break more easily.  Heal more slowly.  Peel and crack. You may also have nerve damage (neuropathy) in your legs and feet, causing decreased feeling in them. This means that you may not notice minor injuries to your feet that could lead to more serious problems. Noticing and addressing any potential problems early is the best way to prevent future foot problems. How to care for your feet Foot hygiene  Wash your feet daily with warm water and mild soap. Do not use hot water. Then, pat your feet and the areas between your toes until they are completely dry. Do not soak your feet as this can dry your skin.  Trim your toenails straight across. Do not dig under them or around the cuticle. File the edges of your nails with an emery board or nail file.  Apply a moisturizing lotion or petroleum jelly to the skin on your feet and to dry, brittle toenails. Use lotion that does not contain alcohol and is unscented. Do not apply lotion between your toes. Shoes and socks  Wear clean socks or stockings every day. Make sure they are not too tight. Do not wear knee-high stockings since they may decrease blood flow to your legs.  Wear shoes that fit properly and have enough cushioning. Always look in your shoes before you put them on to be sure there are no objects inside.  To break in new shoes, wear them for just a few hours a day. This prevents injuries on your feet. Wounds, scrapes, corns, and calluses  Check your feet daily for blisters, cuts, bruises, sores, and redness. If you cannot see the bottom of your feet, use a mirror or ask someone for help.  Do not cut corns or calluses or try to remove them with medicine.  If you  find a minor scrape, cut, or break in the skin on your feet, keep it and the skin around it clean and dry. You may clean these areas with mild soap and water. Do not clean the area with peroxide, alcohol, or iodine.  If you have a wound, scrape, corn, or callus on your foot, look at it several times a day to make sure it is healing and not infected. Check for: ? Redness, swelling, or pain. ? Fluid or blood. ? Warmth. ? Pus or a bad smell. General instructions  Do not cross your legs. This may decrease blood flow to your feet.  Do not use heating pads or hot water bottles on your feet. They may burn your skin. If you have lost feeling in your feet or legs, you may not know this is happening until it is too late.  Protect your feet from hot and cold by wearing shoes, such as at the beach or on hot pavement.  Schedule a complete foot exam at least once a year (annually) or more often if you have foot problems. If you have foot problems, report any cuts, sores, or bruises to your health care provider immediately. Contact a health care provider if:  You have a medical condition that increases your risk of infection and you have any cuts, sores, or bruises on your feet.  You have an injury that is not   healing.  You have redness on your legs or feet.  You feel burning or tingling in your legs or feet.  You have pain or cramps in your legs and feet.  Your legs or feet are numb.  Your feet always feel cold.  You have pain around a toenail. Get help right away if:  You have a wound, scrape, corn, or callus on your foot and: ? You have pain, swelling, or redness that gets worse. ? You have fluid or blood coming from the wound, scrape, corn, or callus. ? Your wound, scrape, corn, or callus feels warm to the touch. ? You have pus or a bad smell coming from the wound, scrape, corn, or callus. ? You have a fever. ? You have a red line going up your leg. Summary  Check your feet every day  for cuts, sores, red spots, swelling, and blisters.  Moisturize feet and legs daily.  Wear shoes that fit properly and have enough cushioning.  If you have foot problems, report any cuts, sores, or bruises to your health care provider immediately.  Schedule a complete foot exam at least once a year (annually) or more often if you have foot problems. This information is not intended to replace advice given to you by your health care provider. Make sure you discuss any questions you have with your health care provider. Document Released: 02/29/2000 Document Revised: 04/15/2017 Document Reviewed: 04/04/2016 Elsevier Interactive Patient Education  2019 [employer]  Onychomycosis/Fungal Toenails  WHAT IS IT? An infection that lies within the keratin of your nail plate that is caused by a fungus.  WHY ME? Fungal infections affect all ages, sexes, races, and creeds.  There may be many factors that predispose you to a fungal infection such as age, coexisting medical conditions such as diabetes, or an autoimmune disease; stress, medications, fatigue, genetics, etc.  Bottom line: fungus thrives in a warm, moist environment and your shoes offer such a location.  IS IT CONTAGIOUS? Theoretically, yes.  You do not want to share shoes, nail clippers or files with someone who has fungal toenails.  Walking around barefoot in the same room or sleeping in the same bed is unlikely to transfer the organism.  It is important to realize, however, that fungus can spread easily from one nail to the next on the same foot.  HOW DO WE TREAT THIS?  There are several ways to treat this condition.  Treatment may depend on many factors such as age, medications, pregnancy, liver and kidney conditions, etc.  It is best to ask your doctor which options are available to you.  1. No treatment.   Unlike many other medical concerns, you can live with this condition.  However for many people this can be a painful condition and  may lead to ingrown toenails or a bacterial infection.  It is recommended that you keep the nails cut short to help reduce the amount of fungal nail. 2. Topical treatment.  These range from herbal remedies to prescription strength nail lacquers.  About 40-50% effective, topicals require twice daily application for approximately 9 to 12 months or until an entirely new nail has grown out.  The most effective topicals are medical grade medications available through physicians offices. 3. Oral antifungal medications.  With an 80-90% cure rate, the most common oral medication requires 3 to 4 months of therapy and stays in your system for a year as the new nail grows out.  Oral antifungal medications do require   blood work to make sure it is a safe drug for you.  A liver function panel will be performed prior to starting the medication and after the first month of treatment.  It is important to have the blood work performed to avoid any harmful side effects.  In general, this medication safe but blood work is required. 4. Laser Therapy.  This treatment is performed by applying a specialized laser to the affected nail plate.  This therapy is noninvasive, fast, and non-painful.  It is not covered by insurance and is therefore, out of pocket.  The results have been very good with a 80-95% cure rate.  The [facility] is the only practice in the area to offer this therapy. 5. Permanent Nail Avulsion.  Removing the entire nail so that a new nail will not grow back. 

## 2018-05-21 NOTE — Telephone Encounter (Signed)
Patient needs the 'needles' for her blood sugar machine sent or called into her pharmacy, [facility] on [address].  Best contact if you need to call her is [phone].

## 2018-06-02 ENCOUNTER — Encounter: Payer: Self-pay | Admitting: Podiatry

## 2018-06-02 NOTE — Progress Notes (Signed)
Subjective: Linda Livingston is a 53 y.o. y.o. female with history of diabetes who presents today with cc of painful, discolored, thick toenails and painful calluses and corns b/l feet which interfere with daily activities. Pain is aggravated when wearing enclosed shoe gear and relieved with periodic professional debridement.  Linda Alpha, DO is her PCP. Last visit was 03/05/2018.  Linda Livingston voices no new pedal concerns on today's visit.  Current Outpatient Medications:  .  Blood Glucose Monitoring Suppl (ONETOUCH VERIO) w/Device KIT, 1 Units by Does not apply route daily., Disp: 1 kit, Rfl: 0 .  glucose blood (ONETOUCH VERIO) test strip, Use as instructed, Disp: 100 each, Rfl: 12 .  Lancet Devices (ONE TOUCH DELICA LANCING DEV) MISC, 1 Units by Does not apply route daily., Disp: 1 each, Rfl: 1 .  latanoprost (XALATAN) 0.005 % ophthalmic solution, INT 1 GTT IN OU HS, Disp: , Rfl: 6 .  metFORMIN (GLUCOPHAGE) 500 MG tablet, Take 2 tablets (1,000 mg total) by mouth 2 (two) times daily with a meal., Disp: 180 tablet, Rfl: 3 .  OneTouch Delica Lancets 41Y MISC, 1 Units by Does not apply route 4 (four) times daily -  before meals and at bedtime for 30 days., Disp: 120 each, Rfl: 11 .  pravastatin (PRAVACHOL) 20 MG tablet, Take 1 tablet (20 mg total) by mouth daily., Disp: 90 tablet, Rfl: 3  No Known Allergies  Objective:  Vascular Examination: Capillary refill time immediate x 10 digits.  Dorsalis pedis pulses palpable b/l.  Posterior tibial pulses palpable b/l.  Digital hair absent x 10 digits.  Skin temperature gradient WNL b/l.  Dermatological Examination: Skin with normal turgor, texture and tone b/l.  Toenails 1-5 b/l discolored, thick, dystrophic with subungual debris and pain with palpation to nailbeds due to thickness of nails.  Hyperkeratotic lesion distal tip 2nd digits b/l. No erythema, no edema, no drainage, no flocculence noted.  Hyperkeratotic lesions bilateral  hallux and submetatarsal head 2 right foot. No erythema, no edema, no drainage, no flocculence noted.  Musculoskeletal: Muscle strength 5/5 to all LE muscle groups  Neurological: Sensation intact with 10 gram monofilament.  Vibratory sensation intact.  Assessment: 1. Painful onychomycosis toenails 1-5 b/l 2.  Calluses b/l hallux and submet head 2 right foot 3.  Corns distal tip 2nd digits b/l 4.  NIDDM  Plan: 1. Continue diabetic foot care principles. Literature dispensed on today. 2. Toenails 1-5 b/l were debrided in length and girth without iatrogenic bleeding. 3. Hyperkeratotic lesion(s)  b/l hallux and submet head 2 right foot, distal tip 2nd digits b/l pared with sterile scalpel blade without incident. 4. Patient to continue soft, supportive shoe gear daily. 5. Patient to report any pedal injuries to medical professional immediately. 6. Follow up 3 months.  7. Patient/POA to call should there be a concern in the interim.

## 2018-08-27 ENCOUNTER — Ambulatory Visit: Payer: Medicare Other | Admitting: Podiatry

## 2018-09-02 ENCOUNTER — Other Ambulatory Visit: Payer: Self-pay | Admitting: Family Medicine

## 2018-09-10 ENCOUNTER — Encounter: Payer: Self-pay | Admitting: Podiatry

## 2018-09-10 ENCOUNTER — Other Ambulatory Visit: Payer: Self-pay

## 2018-09-10 ENCOUNTER — Ambulatory Visit (INDEPENDENT_AMBULATORY_CARE_PROVIDER_SITE_OTHER): Payer: Medicare Other | Admitting: Podiatry

## 2018-09-10 VITALS — Temp 98.0°F

## 2018-09-10 DIAGNOSIS — B351 Tinea unguium: Secondary | ICD-10-CM

## 2018-09-10 DIAGNOSIS — M79675 Pain in left toe(s): Secondary | ICD-10-CM | POA: Diagnosis not present

## 2018-09-10 DIAGNOSIS — M79674 Pain in right toe(s): Secondary | ICD-10-CM | POA: Diagnosis not present

## 2018-09-10 DIAGNOSIS — E119 Type 2 diabetes mellitus without complications: Secondary | ICD-10-CM

## 2018-09-10 DIAGNOSIS — L84 Corns and callosities: Secondary | ICD-10-CM | POA: Diagnosis not present

## 2018-09-10 NOTE — Patient Instructions (Addendum)
Corns and Calluses Corns are small areas of thickened skin that occur on the top, sides, or tip of a toe. They contain a cone-shaped core with a point that can press on a nerve below. This causes pain.  Calluses are areas of thickened skin that can occur anywhere on the body, including the hands, fingers, palms, soles of the feet, and heels. Calluses are usually larger than corns. What are the causes? Corns and calluses are caused by rubbing (friction) or pressure, such as from shoes that are too tight or do not fit properly. What increases the risk? Corns are more likely to develop in people who have misshapen toes (toe deformities), such as hammer toes. Calluses can occur with friction to any area of the skin. They are more likely to develop in people who:  Work with their hands.  Wear shoes that fit poorly, are too tight, or are high-heeled.  Have toe deformities. What are the signs or symptoms? Symptoms of a corn or callus include:  A hard growth on the skin.  Pain or tenderness under the skin.  Redness and swelling.  Increased discomfort while wearing tight-fitting shoes, if your feet are affected. If a corn or callus becomes infected, symptoms may include:  Redness and swelling that gets worse.  Pain.  Fluid, blood, or pus draining from the corn or callus. How is this diagnosed? Corns and calluses may be diagnosed based on your symptoms, your medical history, and a physical exam. How is this treated? Treatment for corns and calluses may include:  Removing the cause of the friction or pressure. This may involve: ? Changing your shoes. ? Wearing shoe inserts (orthotics) or other protective layers in your shoes, such as a corn pad. ? Wearing gloves.  Applying medicine to the skin (topical medicine) to help soften skin in the hardened, thickened areas.  Removing layers of dead skin with a file to reduce the size of the corn or callus.  Removing the corn or callus with a  scalpel or laser.  Taking antibiotic medicines, if your corn or callus is infected.  Having surgery, if a toe deformity is the cause. Follow these instructions at home:   Take over-the-counter and prescription medicines only as told by your health care provider.  If you were prescribed an antibiotic, take it as told by your health care provider. Do not stop taking it even if your condition starts to improve.  Wear shoes that fit well. Avoid wearing high-heeled shoes and shoes that are too tight or too loose.  Wear any padding, protective layers, gloves, or orthotics as told by your health care provider.  Soak your hands or feet and then use a file or pumice stone to soften your corn or callus. Do this as told by your health care provider.  Check your corn or callus every day for symptoms of infection. Contact a health care provider if you:  Notice that your symptoms do not improve with treatment.  Have redness or swelling that gets worse.  Notice that your corn or callus becomes painful.  Have fluid, blood, or pus coming from your corn or callus.  Have new symptoms. Summary  Corns are small areas of thickened skin that occur on the top, sides, or tip of a toe.  Calluses are areas of thickened skin that can occur anywhere on the body, including the hands, fingers, palms, and soles of the feet. Calluses are usually larger than corns.  Corns and calluses are caused by   rubbing (friction) or pressure, such as from shoes that are too tight or do not fit properly.  Treatment may include wearing any padding, protective layers, gloves, or orthotics as told by your health care provider. This information is not intended to replace advice given to you by your health care provider. Make sure you discuss any questions you have with your health care provider. Document Released: 12/08/2003 Document Revised: 06/23/2018 Document Reviewed: 01/14/2017 Elsevier Patient Education  [employer].  Diabetes Mellitus and [place] care is an important part of your health, especially when you have diabetes. Diabetes may cause you to have problems because of poor blood flow (circulation) to your feet and legs, which can cause your skin to:  Become thinner and drier.  Break more easily.  Heal more slowly.  Peel and crack. You may also have nerve damage (neuropathy) in your legs and feet, causing decreased feeling in them. This means that you may not notice minor injuries to your feet that could lead to more serious problems. Noticing and addressing any potential problems early is the best way to prevent future foot problems. How to care for your feet Foot hygiene  Wash your feet daily with warm water and mild soap. Do not use hot water. Then, pat your feet and the areas between your toes until they are completely dry. Do not soak your feet as this can dry your skin.  Trim your toenails straight across. Do not dig under them or around the cuticle. File the edges of your nails with an emery board or nail file.  Apply a moisturizing lotion or petroleum jelly to the skin on your feet and to dry, brittle toenails. Use lotion that does not contain alcohol and is unscented. Do not apply lotion between your toes. Shoes and socks  Wear clean socks or stockings every day. Make sure they are not too tight. Do not wear knee-high stockings since they may decrease blood flow to your legs.  Wear shoes that fit properly and have enough cushioning. Always look in your shoes before you put them on to be sure there are no objects inside.  To break in new shoes, wear them for just a few hours a day. This prevents injuries on your feet. Wounds, scrapes, corns, and calluses  Check your feet daily for blisters, cuts, bruises, sores, and redness. If you cannot see the bottom of your feet, use a mirror or ask someone for help.  Do not cut corns or calluses or try to remove them with medicine.   If you find a minor scrape, cut, or break in the skin on your feet, keep it and the skin around it clean and dry. You may clean these areas with mild soap and water. Do not clean the area with peroxide, alcohol, or iodine.  If you have a wound, scrape, corn, or callus on your foot, look at it several times a day to make sure it is healing and not infected. Check for: ? Redness, swelling, or pain. ? Fluid or blood. ? Warmth. ? Pus or a bad smell. General instructions  Do not cross your legs. This may decrease blood flow to your feet.  Do not use heating pads or hot water bottles on your feet. They may burn your skin. If you have lost feeling in your feet or legs, you may not know this is happening until it is too late.  Protect your feet from hot and cold by wearing shoes,  such as at the beach or on hot pavement.  Schedule a complete foot exam at least once a year (annually) or more often if you have foot problems. If you have foot problems, report any cuts, sores, or bruises to your health care provider immediately. Contact a health care provider if:  You have a medical condition that increases your risk of infection and you have any cuts, sores, or bruises on your feet.  You have an injury that is not healing.  You have redness on your legs or feet.  You feel burning or tingling in your legs or feet.  You have pain or cramps in your legs and feet.  Your legs or feet are numb.  Your feet always feel cold.  You have pain around a toenail. Get help right away if:  You have a wound, scrape, corn, or callus on your foot and: ? You have pain, swelling, or redness that gets worse. ? You have fluid or blood coming from the wound, scrape, corn, or callus. ? Your wound, scrape, corn, or callus feels warm to the touch. ? You have pus or a bad smell coming from the wound, scrape, corn, or callus. ? You have a fever. ? You have a red line going up your leg. Summary  Check your feet  every day for cuts, sores, red spots, swelling, and blisters.  Moisturize feet and legs daily.  Wear shoes that fit properly and have enough cushioning.  If you have foot problems, report any cuts, sores, or bruises to your health care provider immediately.  Schedule a complete foot exam at least once a year (annually) or more often if you have foot problems. This information is not intended to replace advice given to you by your health care provider. Make sure you discuss any questions you have with your health care provider. Document Released: 02/29/2000 Document Revised: 04/15/2017 Document Reviewed: 04/04/2016 Elsevier Patient Education  2020 [employer].

## 2018-09-18 NOTE — Progress Notes (Signed)
Subjective: Linda Livingston is a 53 y.o. y.o. female who presents today for preventative diabetic foot care with painful mycotic toenails, corns and calluses which interfere with daily activities. Pain is aggravated when wearing enclosed shoe gear and relieved with periodic professional debridement.  [name], DO is her PCP. Last visit was March 05, 2018.   Current Outpatient Medications:  .  Blood Glucose Monitoring Suppl (ONETOUCH VERIO) w/Device KIT, 1 Units by Does not apply route daily., Disp: 1 kit, Rfl: 0 .  glucose blood (ONETOUCH VERIO) test strip, Use as instructed, Disp: 100 each, Rfl: 12 .  Lancet Devices (ONE TOUCH DELICA LANCING DEV) MISC, 1 Units by Does not apply route daily., Disp: 1 each, Rfl: 1 .  latanoprost (XALATAN) 0.005 % ophthalmic solution, INT 1 GTT IN OU HS, Disp: , Rfl: 6 .  metFORMIN (GLUCOPHAGE) 500 MG tablet, Take 2 tablets (1,000 mg total) by mouth 2 (two) times daily with a meal., Disp: 180 tablet, Rfl: 3 .  pravastatin (PRAVACHOL) 20 MG tablet, TAKE 1 TABLET(20 MG) BY MOUTH DAILY, Disp: 90 tablet, Rfl: 3 .  OneTouch Delica Lancets 47U MISC, 1 Units by Does not apply route 4 (four) times daily -  before meals and at bedtime for 30 days., Disp: 120 each, Rfl: 11  No Known Allergies  Objective: Vitals:   09/10/18 1515  Temp: 98 F (36.7 C)    Vascular Examination: Capillary refill time immediate x 10 digits.  Dorsalis pedis pulses palpable b/l.  Posterior tibial pulses palpable b/l.  Digital hair absent x 10 digits.  Skin temperature gradient WNL b/l.  Dermatological Examination: Skin with normal turgor, texture and tone b/l.  Toenails 1-5 b/l discolored, thick, dystrophic with subungual debris and pain with palpation to nailbeds due to thickness of nails.  Hyperkeratotic lesion distal tip 2nd digits b/l, b/l hallux and submetatarsal head 2 right foot. No erythema, no edema, no drainage, no flocculence noted.    Musculoskeletal: Muscle strength 5/5 to all LE muscle groups  Neurological: Sensation intact 5/5 b/l with 10 gram monofilament.  Vibratory sensation intact b/l.  Assessment: 1. Painful onychomycosis toenails 1-5 b/l 2.  Callus x 3 3.  Corns x 2 4.  NIDDM  Plan: 1. Continue diabetic foot care principles. Literature dispensed on today. 2. Toenails 1-5 b/l were debrided in length and girth without iatrogenic bleeding. 3. Hyperkeratotic lesion(s) pareddistal tip 2nd digits b/l, b/l hallux and submetatarsal head 2 right foot with sterile scalpel blade without incident. 4. Patient to continue soft, supportive shoe gear daily. 5. Patient to report any pedal injuries to medical professional immediately. 6. Follow up 3 months.  7. Patient/POA to call should there be a concern in the interim.

## 2018-10-07 ENCOUNTER — Other Ambulatory Visit: Payer: Self-pay

## 2018-10-07 NOTE — Patient Outreach (Signed)
[facility] Care Management  10/07/2018  Linda Livingston 22-Nov-1965 [id number]   Medication Adherence call to Linda Livingston Telephone call to Patient regarding Medication Adherence unable to reach patient Linda Livingston is showing past due on Pravastatin under [place].   [place] [facility] Direct Dial [phone]  Fax [phone] [email or web address]

## 2018-12-07 ENCOUNTER — Other Ambulatory Visit: Payer: Self-pay | Admitting: Family Medicine

## 2018-12-10 ENCOUNTER — Encounter: Payer: Self-pay | Admitting: Podiatry

## 2018-12-10 ENCOUNTER — Other Ambulatory Visit: Payer: Self-pay

## 2018-12-10 ENCOUNTER — Ambulatory Visit (INDEPENDENT_AMBULATORY_CARE_PROVIDER_SITE_OTHER): Payer: Medicare Other | Admitting: Podiatry

## 2018-12-10 DIAGNOSIS — L84 Corns and callosities: Secondary | ICD-10-CM

## 2018-12-10 DIAGNOSIS — E119 Type 2 diabetes mellitus without complications: Secondary | ICD-10-CM

## 2018-12-10 DIAGNOSIS — B351 Tinea unguium: Secondary | ICD-10-CM

## 2018-12-10 DIAGNOSIS — M79675 Pain in left toe(s): Secondary | ICD-10-CM

## 2018-12-10 DIAGNOSIS — M79674 Pain in right toe(s): Secondary | ICD-10-CM | POA: Diagnosis not present

## 2018-12-10 NOTE — Patient Instructions (Addendum)
Diabetes Mellitus and Foot Care Foot care is an important part of your health, especially when you have diabetes. Diabetes may cause you to have problems because of poor blood flow (circulation) to your feet and legs, which can cause your skin to:  Become thinner and drier.  Break more easily.  Heal more slowly.  Peel and crack. You may also have nerve damage (neuropathy) in your legs and feet, causing decreased feeling in them. This means that you may not notice minor injuries to your feet that could lead to more serious problems. Noticing and addressing any potential problems early is the best way to prevent future foot problems. How to care for your feet Foot hygiene  Wash your feet daily with warm water and mild soap. Do not use hot water. Then, pat your feet and the areas between your toes until they are completely dry. Do not soak your feet as this can dry your skin.  Trim your toenails straight across. Do not dig under them or around the cuticle. File the edges of your nails with an emery board or nail file.  Apply a moisturizing lotion or petroleum jelly to the skin on your feet and to dry, brittle toenails. Use lotion that does not contain alcohol and is unscented. Do not apply lotion between your toes. Shoes and socks  Wear clean socks or stockings every day. Make sure they are not too tight. Do not wear knee-high stockings since they may decrease blood flow to your legs.  Wear shoes that fit properly and have enough cushioning. Always look in your shoes before you put them on to be sure there are no objects inside.  To break in new shoes, wear them for just a few hours a day. This prevents injuries on your feet. Wounds, scrapes, corns, and calluses  Check your feet daily for blisters, cuts, bruises, sores, and redness. If you cannot see the bottom of your feet, use a mirror or ask someone for help.  Do not cut corns or calluses or try to remove them with medicine.  If you  find a minor scrape, cut, or break in the skin on your feet, keep it and the skin around it clean and dry. You may clean these areas with mild soap and water. Do not clean the area with peroxide, alcohol, or iodine.  If you have a wound, scrape, corn, or callus on your foot, look at it several times a day to make sure it is healing and not infected. Check for: ? Redness, swelling, or pain. ? Fluid or blood. ? Warmth. ? Pus or a bad smell. General instructions  Do not cross your legs. This may decrease blood flow to your feet.  Do not use heating pads or hot water bottles on your feet. They may burn your skin. If you have lost feeling in your feet or legs, you may not know this is happening until it is too late.  Protect your feet from hot and cold by wearing shoes, such as at the beach or on hot pavement.  Schedule a complete foot exam at least once a year (annually) or more often if you have foot problems. If you have foot problems, report any cuts, sores, or bruises to your health care provider immediately. Contact a health care provider if:  You have a medical condition that increases your risk of infection and you have any cuts, sores, or bruises on your feet.  You have an injury that is not   healing.  You have redness on your legs or feet.  You feel burning or tingling in your legs or feet.  You have pain or cramps in your legs and feet.  Your legs or feet are numb.  Your feet always feel cold.  You have pain around a toenail. Get help right away if:  You have a wound, scrape, corn, or callus on your foot and: ? You have pain, swelling, or redness that gets worse. ? You have fluid or blood coming from the wound, scrape, corn, or callus. ? Your wound, scrape, corn, or callus feels warm to the touch. ? You have pus or a bad smell coming from the wound, scrape, corn, or callus. ? You have a fever. ? You have a red line going up your leg. Summary  Check your feet every day  for cuts, sores, red spots, swelling, and blisters.  Moisturize feet and legs daily.  Wear shoes that fit properly and have enough cushioning.  If you have foot problems, report any cuts, sores, or bruises to your health care provider immediately.  Schedule a complete foot exam at least once a year (annually) or more often if you have foot problems. This information is not intended to replace advice given to you by your health care provider. Make sure you discuss any questions you have with your health care provider. Document Released: 02/29/2000 Document Revised: 04/15/2017 Document Reviewed: 04/04/2016 Elsevier Patient Education  2020 [employer]  Corns and Calluses Corns are small areas of thickened skin that occur on the top, sides, or tip of a toe. They contain a cone-shaped core with a point that can press on a nerve below. This causes pain.  Calluses are areas of thickened skin that can occur anywhere on the body, including the hands, fingers, palms, soles of the feet, and heels. Calluses are usually larger than corns. What are the causes? Corns and calluses are caused by rubbing (friction) or pressure, such as from shoes that are too tight or do not fit properly. What increases the risk? Corns are more likely to develop in people who have misshapen toes (toe deformities), such as hammer toes. Calluses can occur with friction to any area of the skin. They are more likely to develop in people who:  Work with their hands.  Wear shoes that fit poorly, are too tight, or are high-heeled.  Have toe deformities. What are the signs or symptoms? Symptoms of a corn or callus include:  A hard growth on the skin.  Pain or tenderness under the skin.  Redness and swelling.  Increased discomfort while wearing tight-fitting shoes, if your feet are affected. If a corn or callus becomes infected, symptoms may include:  Redness and swelling that gets worse.  Pain.  Fluid, blood, or  pus draining from the corn or callus. How is this diagnosed? Corns and calluses may be diagnosed based on your symptoms, your medical history, and a physical exam. How is this treated? Treatment for corns and calluses may include:  Removing the cause of the friction or pressure. This may involve: ? Changing your shoes. ? Wearing shoe inserts (orthotics) or other protective layers in your shoes, such as a corn pad. ? Wearing gloves.  Applying medicine to the skin (topical medicine) to help soften skin in the hardened, thickened areas.  Removing layers of dead skin with a file to reduce the size of the corn or callus.  Removing the corn or callus with a scalpel or   laser.  Taking antibiotic medicines, if your corn or callus is infected.  Having surgery, if a toe deformity is the cause. Follow these instructions at home:   Take over-the-counter and prescription medicines only as told by your health care provider.  If you were prescribed an antibiotic, take it as told by your health care provider. Do not stop taking it even if your condition starts to improve.  Wear shoes that fit well. Avoid wearing high-heeled shoes and shoes that are too tight or too loose.  Wear any padding, protective layers, gloves, or orthotics as told by your health care provider.  Soak your hands or feet and then use a file or pumice stone to soften your corn or callus. Do this as told by your health care provider.  Check your corn or callus every day for symptoms of infection. Contact a health care provider if you:  Notice that your symptoms do not improve with treatment.  Have redness or swelling that gets worse.  Notice that your corn or callus becomes painful.  Have fluid, blood, or pus coming from your corn or callus.  Have new symptoms. Summary  Corns are small areas of thickened skin that occur on the top, sides, or tip of a toe.  Calluses are areas of thickened skin that can occur anywhere  on the body, including the hands, fingers, palms, and soles of the feet. Calluses are usually larger than corns.  Corns and calluses are caused by rubbing (friction) or pressure, such as from shoes that are too tight or do not fit properly.  Treatment may include wearing any padding, protective layers, gloves, or orthotics as told by your health care provider. This information is not intended to replace advice given to you by your health care provider. Make sure you discuss any questions you have with your health care provider. Document Released: 12/08/2003 Document Revised: 06/23/2018 Document Reviewed: 01/14/2017 Elsevier Patient Education  2020 [employer]  Onychomycosis/Fungal Toenails  WHAT IS IT? An infection that lies within the keratin of your nail plate that is caused by a fungus.  WHY ME? Fungal infections affect all ages, sexes, races, and creeds.  There may be many factors that predispose you to a fungal infection such as age, coexisting medical conditions such as diabetes, or an autoimmune disease; stress, medications, fatigue, genetics, etc.  Bottom line: fungus thrives in a warm, moist environment and your shoes offer such a location.  IS IT CONTAGIOUS? Theoretically, yes.  You do not want to share shoes, nail clippers or files with someone who has fungal toenails.  Walking around barefoot in the same room or sleeping in the same bed is unlikely to transfer the organism.  It is important to realize, however, that fungus can spread easily from one nail to the next on the same foot.  HOW DO WE TREAT THIS?  There are several ways to treat this condition.  Treatment may depend on many factors such as age, medications, pregnancy, liver and kidney conditions, etc.  It is best to ask your doctor which options are available to you.  1. No treatment.   Unlike many other medical concerns, you can live with this condition.  However for many people this can be a painful condition and may  lead to ingrown toenails or a bacterial infection.  It is recommended that you keep the nails cut short to help reduce the amount of fungal nail. 2. Topical treatment.  These range from herbal remedies to prescription   strength nail lacquers.  About 40-50% effective, topicals require twice daily application for approximately 9 to 12 months or until an entirely new nail has grown out.  The most effective topicals are medical grade medications available through physicians offices. 3. Oral antifungal medications.  With an 80-90% cure rate, the most common oral medication requires 3 to 4 months of therapy and stays in your system for a year as the new nail grows out.  Oral antifungal medications do require blood work to make sure it is a safe drug for you.  A liver function panel will be performed prior to starting the medication and after the first month of treatment.  It is important to have the blood work performed to avoid any harmful side effects.  In general, this medication safe but blood work is required. 4. Laser Therapy.  This treatment is performed by applying a specialized laser to the affected nail plate.  This therapy is noninvasive, fast, and non-painful.  It is not covered by insurance and is therefore, out of pocket.  The results have been very good with a 80-95% cure rate.  [facility] is the only practice in the area to offer this therapy. 5. Permanent Nail Avulsion.  Removing the entire nail so that a new nail will not grow back. 

## 2018-12-15 ENCOUNTER — Encounter: Payer: Self-pay | Admitting: Podiatry

## 2018-12-15 NOTE — Progress Notes (Signed)
Subjective: Linda Livingston presents to clinic with cc of painful mycotic toenails and multiple callosities b/l  which are aggravated when weightbearing with and without shoe gear.  This pain limits her daily activities. Pain symptoms resolve with periodic professional debridement.  [name], DO is her PCP.   Current Outpatient Medications on File Prior to Visit  Medication Sig Dispense Refill  . Blood Glucose Monitoring Suppl (ONETOUCH VERIO) w/Device KIT 1 Units by Does not apply route daily. 1 kit 0  . Lancet Devices (ONE TOUCH DELICA LANCING DEV) MISC 1 Units by Does not apply route daily. 1 each 1  . latanoprost (XALATAN) 0.005 % ophthalmic solution INT 1 GTT IN OU HS  6  . metFORMIN (GLUCOPHAGE) 500 MG tablet Take 2 tablets (1,000 mg total) by mouth 2 (two) times daily with a meal. 180 tablet 3  . ONETOUCH VERIO test strip TEST 4 TIMES DAILY BEFORE MEALS AND BEDTIME 100 strip 11  . pravastatin (PRAVACHOL) 20 MG tablet TAKE 1 TABLET(20 MG) BY MOUTH DAILY 90 tablet 3  . OneTouch Delica Lancets 59D MISC 1 Units by Does not apply route 4 (four) times daily -  before meals and at bedtime for 30 days. 120 each 11   No current facility-administered medications on file prior to visit.     No Known Allergies   Objective: Physical Examination:  Vascular  Examination: Capillary refill time immediate x 10 digits.  Palpable DP/PT pulses b/l.  Digital hair absent b/l.  No edema noted b/l.  Skin temperature gradient WNL b/l.  Dermatological Examination: Skin with normal turgor, texture and tone b/l.  No open wounds b/l.  No interdigital macerations noted b/l.  Elongated, thick, discolored brittle toenails with subungual debris and pain on dorsal palpation of nailbeds 1-5 b/l.  Hyperkeratotic lesions submet head 2 right foot, distal tip 2nd digit b/l, b/l hallux with tenderness to palpation. No edema, no erythema, no drainage, no flocculence.   Musculoskeletal  Examination: Muscle strength 5/5 to all muscle groups b/l.  No pain, crepitus or joint discomfort with active/passive ROM.  Neurological Examination: Sensation intact 5/5 b/l with 10 gram monofilament.  Vibratory sensation intact b/l.  Proprioceptive sensation intact b/l.  Assessment: 1. Mycotic nail infection with pain 1-5 b/l 2. Corns and callosities lsubmet head 2 right foot, distal tip 2nd digit b/l, b/l hallux 3. NIDDM  Plan: 1. Toenails 1-5 b/l were debrided in length and girth without iatrogenic laceration. 2. Hyperkeratotic lesions pared submet head 2 right foot, distal tip 2nd digit b/l, b/l hallux utilizing sterile scalpel blade without inciden 3. Continue soft, supportive shoe gear daily. 4. Report any pedal injuries to medical professional. 5. Follow up 3 months. 6. Patient/POA to call should there be a question/concern in there interim.

## 2018-12-27 ENCOUNTER — Other Ambulatory Visit: Payer: Self-pay

## 2018-12-27 NOTE — Patient Outreach (Signed)
[place] [facility]) Care Management  12/27/2018  LIVIANNA MAYNOR [birth year] [id number]   Medication Adherence call to Mrs. Jametria Nungester patient did not answer voice mail not set up, patient is past due on Metformin 500 mg under [place].   [place] Management Direct Dial [phone]  Fax [phone] [email or web address]

## 2019-01-19 ENCOUNTER — Other Ambulatory Visit: Payer: Self-pay

## 2019-01-19 MED ORDER — METFORMIN HCL 500 MG PO TABS: 1000.0000 mg | ORAL_TABLET | Freq: Two times a day (BID) | ORAL | 3 refills | Status: DC

## 2019-03-21 ENCOUNTER — Ambulatory Visit (INDEPENDENT_AMBULATORY_CARE_PROVIDER_SITE_OTHER): Payer: Medicare Other | Admitting: Podiatry

## 2019-03-21 ENCOUNTER — Encounter: Payer: Self-pay | Admitting: Podiatry

## 2019-03-21 ENCOUNTER — Other Ambulatory Visit: Payer: Self-pay

## 2019-03-21 DIAGNOSIS — L84 Corns and callosities: Secondary | ICD-10-CM | POA: Diagnosis not present

## 2019-03-21 DIAGNOSIS — E119 Type 2 diabetes mellitus without complications: Secondary | ICD-10-CM

## 2019-03-21 DIAGNOSIS — Q828 Other specified congenital malformations of skin: Secondary | ICD-10-CM

## 2019-03-21 NOTE — Patient Instructions (Addendum)
Diabetes Mellitus and Foot Care Foot care is an important part of your health, especially when you have diabetes. Diabetes may cause you to have problems because of poor blood flow (circulation) to your feet and legs, which can cause your skin to:  Become thinner and drier.  Break more easily.  Heal more slowly.  Peel and crack. You may also have nerve damage (neuropathy) in your legs and feet, causing decreased feeling in them. This means that you may not notice minor injuries to your feet that could lead to more serious problems. Noticing and addressing any potential problems early is the best way to prevent future foot problems. How to care for your feet Foot hygiene  Wash your feet daily with warm water and mild soap. Do not use hot water. Then, pat your feet and the areas between your toes until they are completely dry. Do not soak your feet as this can dry your skin.  Trim your toenails straight across. Do not dig under them or around the cuticle. File the edges of your nails with an emery board or nail file.  Apply a moisturizing lotion or petroleum jelly to the skin on your feet and to dry, brittle toenails. Use lotion that does not contain alcohol and is unscented. Do not apply lotion between your toes. Shoes and socks  Wear clean socks or stockings every day. Make sure they are not too tight. Do not wear knee-high stockings since they may decrease blood flow to your legs.  Wear shoes that fit properly and have enough cushioning. Always look in your shoes before you put them on to be sure there are no objects inside.  To break in new shoes, wear them for just a few hours a day. This prevents injuries on your feet. Wounds, scrapes, corns, and calluses  Check your feet daily for blisters, cuts, bruises, sores, and redness. If you cannot see the bottom of your feet, use a mirror or ask someone for help.  Do not cut corns or calluses or try to remove them with medicine.  If you  find a minor scrape, cut, or break in the skin on your feet, keep it and the skin around it clean and dry. You may clean these areas with mild soap and water. Do not clean the area with peroxide, alcohol, or iodine.  If you have a wound, scrape, corn, or callus on your foot, look at it several times a day to make sure it is healing and not infected. Check for: ? Redness, swelling, or pain. ? Fluid or blood. ? Warmth. ? Pus or a bad smell. General instructions  Do not cross your legs. This may decrease blood flow to your feet.  Do not use heating pads or hot water bottles on your feet. They may burn your skin. If you have lost feeling in your feet or legs, you may not know this is happening until it is too late.  Protect your feet from hot and cold by wearing shoes, such as at the beach or on hot pavement.  Schedule a complete foot exam at least once a year (annually) or more often if you have foot problems. If you have foot problems, report any cuts, sores, or bruises to your health care provider immediately. Contact a health care provider if:  You have a medical condition that increases your risk of infection and you have any cuts, sores, or bruises on your feet.  You have an injury that is not   healing.  You have redness on your legs or feet.  You feel burning or tingling in your legs or feet.  You have pain or cramps in your legs and feet.  Your legs or feet are numb.  Your feet always feel cold.  You have pain around a toenail. Get help right away if:  You have a wound, scrape, corn, or callus on your foot and: ? You have pain, swelling, or redness that gets worse. ? You have fluid or blood coming from the wound, scrape, corn, or callus. ? Your wound, scrape, corn, or callus feels warm to the touch. ? You have pus or a bad smell coming from the wound, scrape, corn, or callus. ? You have a fever. ? You have a red line going up your leg. Summary  Check your feet every day  for cuts, sores, red spots, swelling, and blisters.  Moisturize feet and legs daily.  Wear shoes that fit properly and have enough cushioning.  If you have foot problems, report any cuts, sores, or bruises to your health care provider immediately.  Schedule a complete foot exam at least once a year (annually) or more often if you have foot problems. This information is not intended to replace advice given to you by your health care provider. Make sure you discuss any questions you have with your health care provider. Document Revised: 11/24/2018 Document Reviewed: 04/04/2016 Elsevier Patient Education  2020 [employer]vier Inc.  

## 2019-03-24 NOTE — Progress Notes (Signed)
Subjective: Linda Livingston is a 54 y.o. y.o. female with h/o diabetes who presents today for preventative diabetic foot care. Patient has painful, elongated mycotic toenails and porokeratotic lesions which pose a risk and interfere with daily activities. Pain is aggravated when wearing enclosed shoe gear and relieved with periodic professional debridement.  Linda Alpha, DO is patient's PCP.   Medications reviewed in chart.  No Known Allergies  Objective: There were no vitals filed for this visit.  Vascular Examination: Capillary refill time immediate x 10 digits.  Dorsalis pedis present b/l.  Posterior tibial pulses present b/l.  Digital hair absent b/l.  Skin temperature gradient WNL b/l.  Dermatological Examination: Skin with normal turgor, texture and tone b/l.  Toenails 1-5 b/l mycotic with adequate length.   Hyperkeratotic lesions distal tip b/l 2nd digits and b/l hallux.  No erythema, no edema, no drainage, no flocculence noted.   Porokeratotic lesions submet head 2 right foot with tenderness to palpation. No erythema, no edema, no drainage, no flocculence.   Musculoskeletal: Muscle strength 5/5 to all LE muscle groups b/l.  No pain, crepitus or joint discomfort with active/passive ROM.  Neurological: Sensation intact 5/5 b/l with 10 gram monofilament.  Vibratory sensation intact b/l.  Assessment: 1.  Calluses distal tip b/l 2nd digits and b/l hallux 2.  Porokeratosis submet head 2 right foot 3.  NIDDM  Plan: 1. Continue diabetic foot care principles. Literature dispensed on today. 2. Hyperkeratotic lesion(s) distal tip b/l 2nd digits and b/l hallux pared with sterile scalpel blade without incident. 3. Porokeratosis submet head 2 right foot pared and enucleated with sterile scalpel blade without incident.  4. Patient to continue soft, supportive shoe gear daily. 5. Patient to report any pedal injuries to medical professional immediately. 6. Follow up  3 months.  7. Patient/POA to call should there be a concern in the interim.

## 2019-06-20 ENCOUNTER — Encounter: Payer: Self-pay | Admitting: Podiatry

## 2019-06-20 ENCOUNTER — Ambulatory Visit (INDEPENDENT_AMBULATORY_CARE_PROVIDER_SITE_OTHER): Payer: Medicare Other | Admitting: Podiatry

## 2019-06-20 ENCOUNTER — Other Ambulatory Visit: Payer: Self-pay

## 2019-06-20 VITALS — Temp 97.3°F

## 2019-06-20 DIAGNOSIS — L84 Corns and callosities: Secondary | ICD-10-CM | POA: Diagnosis not present

## 2019-06-20 DIAGNOSIS — M79671 Pain in right foot: Secondary | ICD-10-CM | POA: Diagnosis not present

## 2019-06-20 DIAGNOSIS — E119 Type 2 diabetes mellitus without complications: Secondary | ICD-10-CM

## 2019-06-20 DIAGNOSIS — Q828 Other specified congenital malformations of skin: Secondary | ICD-10-CM

## 2019-06-20 DIAGNOSIS — M79672 Pain in left foot: Secondary | ICD-10-CM | POA: Diagnosis not present

## 2019-06-20 NOTE — Patient Instructions (Signed)
Diabetes Mellitus and Foot Care Foot care is an important part of your health, especially when you have diabetes. Diabetes may cause you to have problems because of poor blood flow (circulation) to your feet and legs, which can cause your skin to:  Become thinner and drier.  Break more easily.  Heal more slowly.  Peel and crack. You may also have nerve damage (neuropathy) in your legs and feet, causing decreased feeling in them. This means that you may not notice minor injuries to your feet that could lead to more serious problems. Noticing and addressing any potential problems early is the best way to prevent future foot problems. How to care for your feet Foot hygiene  Wash your feet daily with warm water and mild soap. Do not use hot water. Then, pat your feet and the areas between your toes until they are completely dry. Do not soak your feet as this can dry your skin.  Trim your toenails straight across. Do not dig under them or around the cuticle. File the edges of your nails with an emery board or nail file.  Apply a moisturizing lotion or petroleum jelly to the skin on your feet and to dry, brittle toenails. Use lotion that does not contain alcohol and is unscented. Do not apply lotion between your toes. Shoes and socks  Wear clean socks or stockings every day. Make sure they are not too tight. Do not wear knee-high stockings since they may decrease blood flow to your legs.  Wear shoes that fit properly and have enough cushioning. Always look in your shoes before you put them on to be sure there are no objects inside.  To break in new shoes, wear them for just a few hours a day. This prevents injuries on your feet. Wounds, scrapes, corns, and calluses  Check your feet daily for blisters, cuts, bruises, sores, and redness. If you cannot see the bottom of your feet, use a mirror or ask someone for help.  Do not cut corns or calluses or try to remove them with medicine.  If you  find a minor scrape, cut, or break in the skin on your feet, keep it and the skin around it clean and dry. You may clean these areas with mild soap and water. Do not clean the area with peroxide, alcohol, or iodine.  If you have a wound, scrape, corn, or callus on your foot, look at it several times a day to make sure it is healing and not infected. Check for: ? Redness, swelling, or pain. ? Fluid or blood. ? Warmth. ? Pus or a bad smell. General instructions  Do not cross your legs. This may decrease blood flow to your feet.  Do not use heating pads or hot water bottles on your feet. They may burn your skin. If you have lost feeling in your feet or legs, you may not know this is happening until it is too late.  Protect your feet from hot and cold by wearing shoes, such as at the beach or on hot pavement.  Schedule a complete foot exam at least once a year (annually) or more often if you have foot problems. If you have foot problems, report any cuts, sores, or bruises to your health care provider immediately. Contact a health care provider if:  You have a medical condition that increases your risk of infection and you have any cuts, sores, or bruises on your feet.  You have an injury that is not   healing.  You have redness on your legs or feet.  You feel burning or tingling in your legs or feet.  You have pain or cramps in your legs and feet.  Your legs or feet are numb.  Your feet always feel cold.  You have pain around a toenail. Get help right away if:  You have a wound, scrape, corn, or callus on your foot and: ? You have pain, swelling, or redness that gets worse. ? You have fluid or blood coming from the wound, scrape, corn, or callus. ? Your wound, scrape, corn, or callus feels warm to the touch. ? You have pus or a bad smell coming from the wound, scrape, corn, or callus. ? You have a fever. ? You have a red line going up your leg. Summary  Check your feet every day  for cuts, sores, red spots, swelling, and blisters.  Moisturize feet and legs daily.  Wear shoes that fit properly and have enough cushioning.  If you have foot problems, report any cuts, sores, or bruises to your health care provider immediately.  Schedule a complete foot exam at least once a year (annually) or more often if you have foot problems. This information is not intended to replace advice given to you by your health care provider. Make sure you discuss any questions you have with your health care provider. Document Revised: 11/24/2018 Document Reviewed: 04/04/2016 Elsevier Patient Education  2020 Elsevier Inc.  Onychomycosis/Fungal Toenails  WHAT IS IT? An infection that lies within the keratin of your nail plate that is caused by a fungus.  WHY ME? Fungal infections affect all ages, sexes, races, and creeds.  There may be many factors that predispose you to a fungal infection such as age, coexisting medical conditions such as diabetes, or an autoimmune disease; stress, medications, fatigue, genetics, etc.  Bottom line: fungus thrives in a warm, moist environment and your shoes offer such a location.  IS IT CONTAGIOUS? Theoretically, yes.  You do not want to share shoes, nail clippers or files with someone who has fungal toenails.  Walking around barefoot in the same room or sleeping in the same bed is unlikely to transfer the organism.  It is important to realize, however, that fungus can spread easily from one nail to the next on the same foot.  HOW DO WE TREAT THIS?  There are several ways to treat this condition.  Treatment may depend on many factors such as age, medications, pregnancy, liver and kidney conditions, etc.  It is best to ask your doctor which options are available to you.  5. No treatment.   Unlike many other medical concerns, you can live with this condition.  However for many people this can be a painful condition and may lead to ingrown toenails or a bacterial  infection.  It is recommended that you keep the nails cut short to help reduce the amount of fungal nail. 6. Topical treatment.  These range from herbal remedies to prescription strength nail lacquers.  About 40-50% effective, topicals require twice daily application for approximately 9 to 12 months or until an entirely new nail has grown out.  The most effective topicals are medical grade medications available through physicians offices. 7. Oral antifungal medications.  With an 80-90% cure rate, the most common oral medication requires 3 to 4 months of therapy and stays in your system for a year as the new nail grows out.  Oral antifungal medications do require blood work to make   sure it is a safe drug for you.  A liver function panel will be performed prior to starting the medication and after the first month of treatment.  It is important to have the blood work performed to avoid any harmful side effects.  In general, this medication safe but blood work is required. 8. Laser Therapy.  This treatment is performed by applying a specialized laser to the affected nail plate.  This therapy is noninvasive, fast, and non-painful.  It is not covered by insurance and is therefore, out of pocket.  The results have been very good with a 80-95% cure rate.  The [facility] is the only practice in the area to offer this therapy. 9. Permanent Nail Avulsion.  Removing the entire nail so that a new nail will not grow back. 

## 2019-06-27 NOTE — Progress Notes (Signed)
Subjective: Linda Livingston presents today for follow up of preventative diabetic foot care, corn(s) b/l feet and callus(es) b/l feet and painful mycotic nails b/l.  Pain interferes with ambulation. Aggravating factors include wearing enclosed shoe gear. and painful plantar lesions plantar right foot.  Pain prevent comfortable ambulation. Aggravating factor is weightbearing with or without shoegear.   She voices no new pedal concerns on today's visit.   No Known Allergies   Objective: Vitals:   06/20/19 1446  Temp: (!) 97.3 F (36.3 C)    Pt 54 y.o. year old female  in NAD. AAO x 3.   Vascular Examination:  Capillary refill time to digits immediate b/l. Palpable DP pulses b/l. Palpable PT pulses b/l. Pedal hair absent b/l Skin temperature gradient within normal limits b/l.  Dermatological Examination: Pedal skin with normal turgor, texture and tone bilaterally. No open wounds bilaterally. No interdigital macerations bilaterally. Toenails 1-5 b/l adequate length and well maintained wihtout any signs of inection. Hyperkeratotic lesion(s) L hallux, L 2nd toe, R hallux and R 2nd toe.  No erythema, no edema, no drainage, no flocculence. Porokeratotic lesion(s) submet head 2 right foot. No erythema, no edema, no drainage, no flocculence.  Musculoskeletal: Normal muscle strength 5/5 to all lower extremity muscle groups bilaterally and no pain crepitus or joint limitation noted with ROM b/l  Neurological: Protective sensation intact 5/5 intact bilaterally with 10g monofilament b/l Vibratory sensation intact b/l  Assessment: 1. Corns and callosities   2. Porokeratosis   3. Pain in both feet   4. Controlled type 2 diabetes mellitus without complication, without long-term current use of insulin ([place])    Plan: -Continue diabetic foot care principles. Literature dispensed on today.  -Toenails 1-5 b/l were debrided in length and girth with sterile nail nippers and dremel without iatrogenic  bleeding.  -Corn(s) L 2nd toe and R 2nd toe and callus(es) L hallux, R hallux and submet head 2 right foot were debrided without complication or incident. Total number debrided =5. -Patient to continue soft, supportive shoe gear daily. -Patient to report any pedal injuries to medical professional immediately. -Patient/POA to call should there be question/concern in the interim.  Return in about 3 months (around 09/19/2019) for diabetic nail trim.

## 2019-07-18 ENCOUNTER — Other Ambulatory Visit: Payer: Self-pay

## 2019-07-18 MED ORDER — METFORMIN HCL 500 MG PO TABS: 1000.0000 mg | ORAL_TABLET | Freq: Two times a day (BID) | ORAL | 3 refills | Status: DC

## 2019-08-01 ENCOUNTER — Ambulatory Visit (INDEPENDENT_AMBULATORY_CARE_PROVIDER_SITE_OTHER): Payer: Medicare Other | Admitting: Family Medicine

## 2019-08-01 ENCOUNTER — Encounter: Payer: Self-pay | Admitting: Family Medicine

## 2019-08-01 ENCOUNTER — Other Ambulatory Visit: Payer: Self-pay

## 2019-08-01 VITALS — BP 118/72 | HR 97 | Wt 173.8 lb

## 2019-08-01 DIAGNOSIS — Z0000 Encounter for general adult medical examination without abnormal findings: Secondary | ICD-10-CM

## 2019-08-01 DIAGNOSIS — E7841 Elevated Lipoprotein(a): Secondary | ICD-10-CM | POA: Diagnosis not present

## 2019-08-01 DIAGNOSIS — E119 Type 2 diabetes mellitus without complications: Secondary | ICD-10-CM

## 2019-08-01 LAB — POCT GLYCOSYLATED HEMOGLOBIN (HGB A1C): HbA1c, POC (controlled diabetic range): 5.5 % (ref 0.0–7.0)

## 2019-08-01 MED ORDER — METFORMIN HCL 500 MG PO TABS: 500.0000 mg | ORAL_TABLET | Freq: Two times a day (BID) | ORAL | 3 refills | Status: DC

## 2019-08-01 MED ORDER — ONETOUCH VERIO VI STRP: ORAL_STRIP | 11 refills | Status: DC

## 2019-08-01 NOTE — Progress Notes (Signed)
    SUBJECTIVE:   CHIEF COMPLAINT / HPI:   T2DM Follow Up Patient is doing well with no concerns today. She is compliant with her medications and continues to eat a well balanced diet, keeps a close monitor on her carbohydrate intake, limits her sweets, snacks, and dose not drink sugary drinks. She is maintaining her weight and has no complaints today. She is checking her feet every day. She does endorse some diarrhea with Metformin but she says it is tolerable.  HLD Still taking statin. Will check lipid panel today.  PERTINENT  PMH / PSH: T2DM, HLD  OBJECTIVE:   BP 118/72   Pulse 97   Wt 173 lb 12.8 oz (78.8 kg)   SpO2 96%   BMI 28.92 kg/m   Gen: Alert and Oriented x 3, NAD CV: RRR, no murmurs, normal S1, S2 split Resp: CTAB, no wheezing, rales, or rhonchi, comfortable work of breathing Ext: no clubbing, cyanosis, or edema Skin: warm, dry, intact, no rashes  ASSESSMENT/PLAN:   Type 2 diabetes mellitus without complication, without long-term current use of insulin (HCC) Chronic, well controlled. Patient has done an amazing job controlling her blood sugar. She has accomplished this mostly with diet modification and exercise. Patient with A1c today was 5.5% - F/u in 3 months - Given her track record of being so well controlled I will decrease Metformin from 1000mg  BID to 500mg  BID - Urine microalbumin/creatnine ratio normal - BMP checked today which was normal - Foot exam normal - Patient will call her eye doctor for yearly eye exam  Hyperlipidemia Lipid panel normal on statin. LDL was 83 which is above 70 but unclear benefit in further reducing it. Given she has no history of CVD but has CVD equivalent and is on a low dose I will increase to moderate intensity statin. - increase pravastatin from 20mg  daily to 40mg  daily - Repeat lipid panel in 3 to 6 months  Ordered Mammogram and referral to GI for colonoscopy done today   [name], DO [place]

## 2019-08-01 NOTE — Patient Instructions (Signed)
It was great to see you today! Thank you for letting me participate in your care!  Today, we discussed your diabetes and it is well under control. You are doing such a great job!!!  Keep up the good work!!  You are doing so well I am decreasing your Metformin dose. Please only take one  500mg  tablet two times per day.   I will call you if your labs are abnormal.   Be well, Harolyn Rutherford, DO PGY-3, [facility]

## 2019-08-02 LAB — BASIC METABOLIC PANEL
BUN/Creatinine Ratio: 17 (ref 9–23)
BUN: 12 mg/dL (ref 6–24)
CO2: 24 mmol/L (ref 20–29)
Calcium: 9.3 mg/dL (ref 8.7–10.2)
Chloride: 104 mmol/L (ref 96–106)
Creatinine, Ser: 0.69 mg/dL (ref 0.57–1.00)
GFR calc Af Amer: 114 mL/min/{1.73_m2} (ref >59)
GFR calc non Af Amer: 99 mL/min/{1.73_m2} (ref >59)
Glucose: 80 mg/dL (ref 65–99)
Potassium: 4.3 mmol/L (ref 3.5–5.2)
Sodium: 142 mmol/L (ref 134–144)

## 2019-08-02 LAB — LIPID PANEL
Chol/HDL Ratio: 2.7 ratio (ref 0.0–4.4)
Cholesterol, Total: 152 mg/dL (ref 100–199)
HDL: 57 mg/dL (ref >39)
LDL Chol Calc (NIH): 83 mg/dL (ref 0–99)
Triglycerides: 59 mg/dL (ref 0–149)
VLDL Cholesterol Cal: 12 mg/dL (ref 5–40)

## 2019-08-03 LAB — MICROALBUMIN / CREATININE URINE RATIO
Creatinine, Urine: 36.7 mg/dL
Microalb/Creat Ratio: 17 mg/g creat (ref 0–29)
Microalbumin, Urine: 6.3 ug/mL

## 2019-08-04 MED ORDER — PRAVASTATIN SODIUM 40 MG PO TABS: 40.0000 mg | ORAL_TABLET | Freq: Every day | ORAL | 2 refills | Status: DC

## 2019-08-04 NOTE — Assessment & Plan Note (Addendum)
Chronic, well controlled. Patient has done an amazing job controlling her blood sugar. She has accomplished this mostly with diet modification and exercise. Patient with A1c today was 5.5% - F/u in 3 months - Given her track record of being so well controlled I will decrease Metformin from 1000mg  BID to 500mg  BID - Urine microalbumin/creatnine ratio normal - BMP checked today which was normal - Foot exam normal - Patient will call her eye doctor for yearly eye exam

## 2019-08-04 NOTE — Assessment & Plan Note (Signed)
Lipid panel normal on statin. LDL was 83 which is above 70 but unclear benefit in further reducing it. Given she has no history of CVD but has CVD equivalent and is on a low dose I will increase to moderate intensity statin. - increase pravastatin from 20mg  daily to 40mg  daily - Repeat lipid panel in 3 to 6 months

## 2019-08-04 NOTE — Addendum Note (Signed)
Addended by: Ezzard Flax on: 08/04/2019 10:37 AM   Modules accepted: Orders

## 2019-09-20 ENCOUNTER — Ambulatory Visit (INDEPENDENT_AMBULATORY_CARE_PROVIDER_SITE_OTHER): Payer: Medicare Other | Admitting: Podiatry

## 2019-09-20 ENCOUNTER — Encounter: Payer: Self-pay | Admitting: Podiatry

## 2019-09-20 ENCOUNTER — Other Ambulatory Visit: Payer: Self-pay

## 2019-09-20 DIAGNOSIS — L84 Corns and callosities: Secondary | ICD-10-CM | POA: Diagnosis not present

## 2019-09-20 DIAGNOSIS — E119 Type 2 diabetes mellitus without complications: Secondary | ICD-10-CM

## 2019-09-20 DIAGNOSIS — B351 Tinea unguium: Secondary | ICD-10-CM | POA: Diagnosis not present

## 2019-09-20 DIAGNOSIS — M79674 Pain in right toe(s): Secondary | ICD-10-CM | POA: Diagnosis not present

## 2019-09-20 DIAGNOSIS — Q828 Other specified congenital malformations of skin: Secondary | ICD-10-CM | POA: Diagnosis not present

## 2019-09-20 DIAGNOSIS — M79675 Pain in left toe(s): Secondary | ICD-10-CM | POA: Diagnosis not present

## 2019-09-20 NOTE — Patient Instructions (Signed)
Diabetes Mellitus and Foot Care Foot care is an important part of your health, especially when you have diabetes. Diabetes may cause you to have problems because of poor blood flow (circulation) to your feet and legs, which can cause your skin to:  Become thinner and drier.  Break more easily.  Heal more slowly.  Peel and crack. You may also have nerve damage (neuropathy) in your legs and feet, causing decreased feeling in them. This means that you may not notice minor injuries to your feet that could lead to more serious problems. Noticing and addressing any potential problems early is the best way to prevent future foot problems. How to care for your feet Foot hygiene  Wash your feet daily with warm water and mild soap. Do not use hot water. Then, pat your feet and the areas between your toes until they are completely dry. Do not soak your feet as this can dry your skin.  Trim your toenails straight across. Do not dig under them or around the cuticle. File the edges of your nails with an emery board or nail file.  Apply a moisturizing lotion or petroleum jelly to the skin on your feet and to dry, brittle toenails. Use lotion that does not contain alcohol and is unscented. Do not apply lotion between your toes. Shoes and socks  Wear clean socks or stockings every day. Make sure they are not too tight. Do not wear knee-high stockings since they may decrease blood flow to your legs.  Wear shoes that fit properly and have enough cushioning. Always look in your shoes before you put them on to be sure there are no objects inside.  To break in new shoes, wear them for just a few hours a day. This prevents injuries on your feet. Wounds, scrapes, corns, and calluses  Check your feet daily for blisters, cuts, bruises, sores, and redness. If you cannot see the bottom of your feet, use a mirror or ask someone for help.  Do not cut corns or calluses or try to remove them with medicine.  If you  find a minor scrape, cut, or break in the skin on your feet, keep it and the skin around it clean and dry. You may clean these areas with mild soap and water. Do not clean the area with peroxide, alcohol, or iodine.  If you have a wound, scrape, corn, or callus on your foot, look at it several times a day to make sure it is healing and not infected. Check for: ? Redness, swelling, or pain. ? Fluid or blood. ? Warmth. ? Pus or a bad smell. General instructions  Do not cross your legs. This may decrease blood flow to your feet.  Do not use heating pads or hot water bottles on your feet. They may burn your skin. If you have lost feeling in your feet or legs, you may not know this is happening until it is too late.  Protect your feet from hot and cold by wearing shoes, such as at the beach or on hot pavement.  Schedule a complete foot exam at least once a year (annually) or more often if you have foot problems. If you have foot problems, report any cuts, sores, or bruises to your health care provider immediately. Contact a health care provider if:  You have a medical condition that increases your risk of infection and you have any cuts, sores, or bruises on your feet.  You have an injury that is not   healing.  You have redness on your legs or feet.  You feel burning or tingling in your legs or feet.  You have pain or cramps in your legs and feet.  Your legs or feet are numb.  Your feet always feel cold.  You have pain around a toenail. Get help right away if:  You have a wound, scrape, corn, or callus on your foot and: ? You have pain, swelling, or redness that gets worse. ? You have fluid or blood coming from the wound, scrape, corn, or callus. ? Your wound, scrape, corn, or callus feels warm to the touch. ? You have pus or a bad smell coming from the wound, scrape, corn, or callus. ? You have a fever. ? You have a red line going up your leg. Summary  Check your feet every day  for cuts, sores, red spots, swelling, and blisters.  Moisturize feet and legs daily.  Wear shoes that fit properly and have enough cushioning.  If you have foot problems, report any cuts, sores, or bruises to your health care provider immediately.  Schedule a complete foot exam at least once a year (annually) or more often if you have foot problems. This information is not intended to replace advice given to you by your health care provider. Make sure you discuss any questions you have with your health care provider. Document Revised: 11/24/2018 Document Reviewed: 04/04/2016 Elsevier Patient Education  2020 [employer] Inc[employer]  

## 2019-09-23 NOTE — Progress Notes (Signed)
Subjective: Linda Livingston presents today preventative diabetic foot care, painful callus(es) b/l feet. Aggravating factors include weightbearing with and without shoe gear. Pain is relieved with periodic professional debridement. and painful porokeratotic lesion(s) right foot and painful mycotic toenails that limit ambulation. Aggravating factors include weightbearing with and without shoe gear. Pain for both is relieved with periodic professional debridement.  [name], DO is patient's PCP. Last visit was: 08/01/2019.  Past Medical History:  Diagnosis Date  . Diabetes mellitus without complication [facility])      Patient Active Problem List   Diagnosis Date Noted  . Callus of foot 01/21/2018  . Valgus deformity of both great toes 01/21/2018  . Need for immunization against influenza 12/02/2017  . Hyperlipidemia 12/02/2017  . Type 2 diabetes mellitus without complication, without long-term current use of insulin ([place]) 10/05/2017  . Elevated lipoprotein(a) 10/05/2017  . Health maintenance examination 09/08/2017  . Pre-ulcerative corn or callous 09/08/2017  . Glaucoma suspect of both eyes 03/26/2017  . Congenital hypertrophy of retinal pigment epithelium 03/26/2017    Current Outpatient Medications on File Prior to Visit  Medication Sig Dispense Refill  . Blood Glucose Monitoring Suppl (ONETOUCH VERIO) w/Device KIT 1 Units by Does not apply route daily. 1 kit 0  . glucose blood (ONETOUCH VERIO) test strip TEST 4 TIMES DAILY BEFORE MEALS AND BEDTIME 100 strip 11  . Lancet Devices (ONE TOUCH DELICA LANCING DEV) MISC 1 Units by Does not apply route daily. 1 each 1  . latanoprost (XALATAN) 0.005 % ophthalmic solution INT 1 GTT IN OU HS  6  . metFORMIN (GLUCOPHAGE) 500 MG tablet Take 1 tablet (500 mg total) by mouth 2 (two) times daily with a meal. 180 tablet 3  . OneTouch Delica Lancets 18E MISC 1 Units by Does not apply route 4 (four) times daily -  before meals and at bedtime for 30  days. 120 each 11  . pravastatin (PRAVACHOL) 40 MG tablet Take 1 tablet (40 mg total) by mouth daily. 30 tablet 2   No current facility-administered medications on file prior to visit.     No Known Allergies  Objective: ANNALEI Livingston is a pleasant 54 y.o. African American female in NAD. AAO x 3.  There were no vitals filed for this visit.  Vascular Examination: Neurovascular status unchanged b/l lower extremities. Capillary refill time to digits immediate b/l. Palpable pedal pulses b/l LE. Pedal hair absent. Lower extremity skin temperature gradient within normal limits. No pain with calf compression b/l. No edema noted b/l lower extremities. No ischemia or gangrene noted b/l lower extremities.  Dermatological Examination: Pedal skin with normal turgor, texture and tone bilaterally. No open wounds bilaterally. No interdigital macerations bilaterally. Toenails 1-5 b/l elongated, discolored, dystrophic, thickened, crumbly with subungual debris and tenderness to dorsal palpation. Hyperkeratotic lesion(s) L hallux, L 2nd toe, R hallux and R 2nd toe.  No erythema, no edema, no drainage, no flocculence. Porokeratotic lesion(s) submet head 2 right foot. No erythema, no edema, no drainage, no flocculence.   Musculoskeletal: Normal muscle strength 5/5 to all lower extremity muscle groups bilaterally. No pain crepitus or joint limitation noted with ROM b/l. Hallux valgus with bunion deformity noted b/l lower extremities. Hammertoes noted to the 2-5 bilaterally.  Neurological Examination: Protective sensation intact 5/5 intact bilaterally with 10g monofilament b/l. Vibratory sensation intact b/l. Clonus negative b/l.  Last A1c: Hemoglobin A1C Latest Ref Rng & Units 08/01/2019  HGBA1C 0.0 - 7.0 % 5.5  Some recent data might be  hidden   Assessment: 1. Pain due to onychomycosis of toenails of both feet   2. Corns and callosities   3. Porokeratosis   4. Controlled type 2 diabetes mellitus  without complication, without long-term current use of insulin ([place])    Plan: -Examined patient. -No new findings. No new orders. -Continue diabetic foot care principles. -Toenails 1-5 b/l were debrided in length and girth with sterile nail nippers and dremel without iatrogenic bleeding.  -Corn(s) L 2nd toe and R 2nd toe and callus(es) L hallux and R hallux were pared utilizing sterile scalpel blade without incident. Total number debrided =4. -Painful porokeratotic lesion(s) submet head 2 right foot pared and enucleated with sterile scalpel blade without incident. Total number of lesions debrided on today including corns and calluses=5. -Patient to report any pedal injuries to medical professional immediately. -Patient to continue soft, supportive shoe gear daily. -Patient/POA to call should there be question/concern in the interim.  Return in about 3 months (around 12/21/2019) for diabetic nail and callus trim.  Marzetta Board, DPM

## 2019-10-14 ENCOUNTER — Other Ambulatory Visit: Payer: Self-pay | Admitting: Family Medicine

## 2019-10-17 ENCOUNTER — Other Ambulatory Visit: Payer: Self-pay | Admitting: *Deleted

## 2019-10-17 NOTE — Telephone Encounter (Signed)
Received a refill request for a former patient, Linda Livingston. Fowarding to new PCP as it will be important for Linda Michelli who has T2DM to continue taking her statin.   I agree with the refill request but as I am no longer her PCP it is inappropriate for me to refill. Please feel free to contact me with any questions.

## 2019-10-18 MED ORDER — PRAVASTATIN SODIUM 40 MG PO TABS: 40.0000 mg | ORAL_TABLET | Freq: Every day | ORAL | 2 refills | Status: DC

## 2019-12-27 ENCOUNTER — Ambulatory Visit (INDEPENDENT_AMBULATORY_CARE_PROVIDER_SITE_OTHER): Payer: Medicare Other | Admitting: Podiatry

## 2019-12-27 ENCOUNTER — Other Ambulatory Visit: Payer: Self-pay

## 2019-12-27 ENCOUNTER — Encounter: Payer: Self-pay | Admitting: Podiatry

## 2019-12-27 DIAGNOSIS — M79675 Pain in left toe(s): Secondary | ICD-10-CM | POA: Diagnosis not present

## 2019-12-27 DIAGNOSIS — M79674 Pain in right toe(s): Secondary | ICD-10-CM

## 2019-12-27 DIAGNOSIS — Q828 Other specified congenital malformations of skin: Secondary | ICD-10-CM | POA: Diagnosis not present

## 2019-12-27 DIAGNOSIS — E119 Type 2 diabetes mellitus without complications: Secondary | ICD-10-CM | POA: Diagnosis not present

## 2019-12-27 DIAGNOSIS — B351 Tinea unguium: Secondary | ICD-10-CM

## 2019-12-27 DIAGNOSIS — L84 Corns and callosities: Secondary | ICD-10-CM

## 2020-01-01 NOTE — Progress Notes (Signed)
Subjective: Linda Livingston presents today preventative diabetic foot care, painful callus(es) b/l feet. Aggravating factors include weightbearing with and without shoe gear. Pain is relieved with periodic professional debridement. and painful porokeratotic lesion(s) right foot and painful mycotic toenails that limit ambulation. Aggravating factors include weightbearing with and without shoe gear. Pain for both is relieved with periodic professional debridement.   Patient states last blood sugar was 113 mg/dl this morning.  Alcus Dad, MD is patient's PCP. Last visit was: 08/01/2019. She voices no new pedal concerns on today's visit.  Past Medical History:  Diagnosis Date   Diabetes mellitus without complication [facility])      Patient Active Problem List   Diagnosis Date Noted   Callus of foot 01/21/2018   Valgus deformity of both great toes 01/21/2018   Need for immunization against influenza 12/02/2017   Hyperlipidemia 12/02/2017   Type 2 diabetes mellitus without complication, without long-term current use of insulin ([place]) 10/05/2017   Elevated lipoprotein(a) 10/05/2017   Health maintenance examination 09/08/2017   Pre-ulcerative corn or callous 09/08/2017   Glaucoma suspect of both eyes 03/26/2017   Congenital hypertrophy of retinal pigment epithelium 03/26/2017    Current Outpatient Medications on File Prior to Visit  Medication Sig Dispense Refill   Blood Glucose Monitoring Suppl (ONETOUCH VERIO) w/Device KIT 1 Units by Does not apply route daily. 1 kit 0   glucose blood (ONETOUCH VERIO) test strip TEST 4 TIMES DAILY BEFORE MEALS AND BEDTIME 100 strip 11   Lancet Devices (ONE TOUCH DELICA LANCING DEV) MISC 1 Units by Does not apply route daily. 1 each 1   latanoprost (XALATAN) 0.005 % ophthalmic solution INT 1 GTT IN OU HS  6   metFORMIN (GLUCOPHAGE) 500 MG tablet Take 1 tablet (500 mg total) by mouth 2 (two) times daily with a meal. 180 tablet 3    pravastatin (PRAVACHOL) 40 MG tablet Take 1 tablet (40 mg total) by mouth daily. 30 tablet 2   OneTouch Delica Lancets 85U MISC 1 Units by Does not apply route 4 (four) times daily -  before meals and at bedtime for 30 days. 120 each 11   No current facility-administered medications on file prior to visit.   No Known Allergies  Objective: KEYARA ENT is a pleasant 54 y.o. African American female in NAD. AAO x 3.  There were no vitals filed for this visit.  Vascular Examination: Neurovascular status unchanged b/l lower extremities. Capillary refill time to digits immediate b/l. Palpable pedal pulses b/l LE. Pedal hair absent. Lower extremity skin temperature gradient within normal limits. No pain with calf compression b/l. No edema noted b/l lower extremities. No ischemia or gangrene noted b/l lower extremities.  Dermatological Examination: Pedal skin with normal turgor, texture and tone bilaterally. No open wounds bilaterally. No interdigital macerations bilaterally. Toenails 1-5 b/l elongated, discolored, dystrophic, thickened, crumbly with subungual debris and tenderness to dorsal palpation. Hyperkeratotic lesion(s) L hallux, L 2nd toe, R hallux and R 2nd toe.  No erythema, no edema, no drainage, no flocculence. Porokeratotic lesion(s) submet head 2 right foot. No erythema, no edema, no drainage, no flocculence.   Musculoskeletal: Normal muscle strength 5/5 to all lower extremity muscle groups bilaterally. No pain crepitus or joint limitation noted with ROM b/l. Hallux valgus with bunion deformity noted b/l lower extremities. Hammertoes noted to the 2-5 bilaterally.  Neurological Examination: Protective sensation intact 5/5 intact bilaterally with 10g monofilament b/l. Vibratory sensation intact b/l. Clonus negative b/l.  Last A1c: Hemoglobin A1C  Latest Ref Rng & Units 08/01/2019  HGBA1C 0.0 - 7.0 % 5.5  Some recent data might be hidden   Assessment: 1. Pain due to onychomycosis  of toenails of both feet   2. Porokeratosis   3. Corns and callosities   4. Controlled type 2 diabetes mellitus without complication, without long-term current use of insulin ([place])    Plan: -Examined patient. -No new findings. No new orders. -Continue diabetic foot care principles. -Toenails 1-5 b/l were debrided in length and girth with sterile nail nippers and dremel without iatrogenic bleeding.  -Corn(s) L 2nd toe and R 2nd toe and callus(es) L hallux and R hallux were pared utilizing sterile scalpel blade without incident. Total number debrided =4. -Painful porokeratotic lesion(s) submet head 2 right foot pared and enucleated with sterile scalpel blade without incident. Total number of lesions debrided on today including corns and calluses=5. -Patient to report any pedal injuries to medical professional immediately. -Patient to continue soft, supportive shoe gear daily. -Patient/POA to call should there be question/concern in the interim.  Return in about 3 months (around 03/28/2020).  Marzetta Board, DPM

## 2020-01-30 ENCOUNTER — Other Ambulatory Visit: Payer: Self-pay | Admitting: Family Medicine

## 2020-04-10 ENCOUNTER — Encounter: Payer: Self-pay | Admitting: Podiatry

## 2020-04-10 ENCOUNTER — Other Ambulatory Visit: Payer: Self-pay

## 2020-04-10 ENCOUNTER — Ambulatory Visit (INDEPENDENT_AMBULATORY_CARE_PROVIDER_SITE_OTHER): Payer: Medicare Other | Admitting: Podiatry

## 2020-04-10 DIAGNOSIS — M2012 Hallux valgus (acquired), left foot: Secondary | ICD-10-CM

## 2020-04-10 DIAGNOSIS — M79674 Pain in right toe(s): Secondary | ICD-10-CM | POA: Diagnosis not present

## 2020-04-10 DIAGNOSIS — E119 Type 2 diabetes mellitus without complications: Secondary | ICD-10-CM | POA: Diagnosis not present

## 2020-04-10 DIAGNOSIS — M2011 Hallux valgus (acquired), right foot: Secondary | ICD-10-CM

## 2020-04-10 DIAGNOSIS — Q828 Other specified congenital malformations of skin: Secondary | ICD-10-CM | POA: Diagnosis not present

## 2020-04-10 DIAGNOSIS — B351 Tinea unguium: Secondary | ICD-10-CM

## 2020-04-10 DIAGNOSIS — M79675 Pain in left toe(s): Secondary | ICD-10-CM

## 2020-04-10 DIAGNOSIS — M2042 Other hammer toe(s) (acquired), left foot: Secondary | ICD-10-CM

## 2020-04-10 DIAGNOSIS — M2041 Other hammer toe(s) (acquired), right foot: Secondary | ICD-10-CM | POA: Diagnosis not present

## 2020-04-10 NOTE — Patient Instructions (Signed)
REUSABLE NON-MEDICATED U-SHAPED GEL CALLUS CUSHION CAN BE PURCHASED AT [email or web address]  

## 2020-04-14 NOTE — Progress Notes (Signed)
ANNUAL DIABETIC FOOT EXAM  Subjective: Linda Livingston presents today for for annual diabetic foot examination and painful porokeratotic lesion(s) b/l feet and painful mycotic toenails that limit ambulation. Painful toenails interfere with ambulation. Aggravating factors include wearing enclosed shoe gear. Pain is relieved with periodic professional debridement. Painful porokeratotic lesions are aggravated when weightbearing with and without shoegear. Pain is relieved with periodic professional debridement..  Patient relates 3 year h/o diabetes.  Patient denies any h/o foot wounds.  Patient denies symptoms of foot numbness.  Patient denies symptoms of foot tingling.  Patient denies symptoms of burning in feet.  Patient's blood sugar was 118 mg/dl this morning.   Alcus Dad, MD is patient's PCP.   Past Medical History:  Diagnosis Date  . Diabetes mellitus without complication [facility])    Patient Active Problem List   Diagnosis Date Noted  . Callus of foot 01/21/2018  . Valgus deformity of both great toes 01/21/2018  . Need for immunization against influenza 12/02/2017  . Hyperlipidemia 12/02/2017  . Type 2 diabetes mellitus without complication, without long-term current use of insulin ([place]) 10/05/2017  . Elevated lipoprotein(a) 10/05/2017  . Health maintenance examination 09/08/2017  . Pre-ulcerative corn or callous 09/08/2017  . Glaucoma suspect of both eyes 03/26/2017  . Congenital hypertrophy of retinal pigment epithelium 03/26/2017   History reviewed. No pertinent surgical history. Current Outpatient Medications on File Prior to Visit  Medication Sig Dispense Refill  . Blood Glucose Monitoring Suppl (ONETOUCH VERIO) w/Device KIT 1 Units by Does not apply route daily. 1 kit 0  . glucose blood (ONETOUCH VERIO) test strip TEST 4 TIMES DAILY BEFORE MEALS AND BEDTIME 100 strip 11  . Lancet Devices (ONE TOUCH DELICA LANCING DEV) MISC 1 Units by Does not apply route daily. 1  each 1  . latanoprost (XALATAN) 0.005 % ophthalmic solution INT 1 GTT IN OU HS  6  . metFORMIN (GLUCOPHAGE) 500 MG tablet Take 1 tablet (500 mg total) by mouth 2 (two) times daily with a meal. 180 tablet 3  . OneTouch Delica Lancets 73S MISC 1 Units by Does not apply route 4 (four) times daily -  before meals and at bedtime for 30 days. 120 each 11  . pravastatin (PRAVACHOL) 40 MG tablet TAKE 1 TABLET(40 MG) BY MOUTH DAILY 30 tablet 2   No current facility-administered medications on file prior to visit.    No Known Allergies Social History   Occupational History  . Not on file  Tobacco Use  . Smoking status: Current Some Day Smoker  . Smokeless tobacco: Never Used  Substance and Sexual Activity  . Alcohol use: Yes    Alcohol/week: 1.0 standard drink    Types: 1 Cans of beer per week    Comment: occas  . Drug use: Never  . Sexual activity: Not on file   History reviewed. No pertinent family history. Immunization History  Administered Date(s) Administered  . Influenza,inj,Quad PF,6+ Mos 12/02/2017  . Pneumococcal Polysaccharide-23 12/02/2017     Review of Systems: Negative except as noted in the HPI.  Objective: There were no vitals filed for this visit.  Linda Livingston is a pleasant 55 y.o. female in NAD. AAO X 3.  Vascular Examination: Capillary refill time to digits immediate b/l. Palpable pedal pulses b/l LE. Pedal hair absent. Lower extremity skin temperature gradient within normal limits. No pain with calf compression b/l. No ischemia or gangrene noted b/l lower extremities.  Dermatological Examination: Pedal skin with normal turgor, texture and  tone bilaterally. No open wounds bilaterally. No interdigital macerations bilaterally. Toenails 1-5 b/l elongated, discolored, dystrophic, thickened, crumbly with subungual debris and tenderness to dorsal palpation. Porokeratotic lesion(s) L hallux, L 2nd toe, R hallux, R 2nd toe and submet head 2 right foot. No erythema, no  edema, no drainage, no fluctuance.  Musculoskeletal Examination: Normal muscle strength 5/5 to all lower extremity muscle groups bilaterally. No pain crepitus or joint limitation noted with ROM b/l. Hallux valgus with bunion deformity noted b/l lower extremities. Hammertoes noted to the 2-5 bilaterally.  Footwear Assessment: Does the patient wear appropriate shoes? Yes. Does the patient need inserts/orthotics? Yes.  Neurological Examination: Protective sensation intact 5/5 intact bilaterally with 10g monofilament b/l. Vibratory sensation intact b/l.  Hemoglobin A1C Latest Ref Rng & Units 08/01/2019  HGBA1C 0.0 - 7.0 % 5.5  Some recent data might be hidden  Assessment: 1. Pain due to onychomycosis of toenails of both feet   2. Porokeratosis   3. Controlled type 2 diabetes mellitus without complication, without long-term current use of insulin ([facility])   4. Hallux valgus, acquired, bilateral   5. Acquired hammertoes of both feet   6. Encounter for diabetic foot exam ([place])     ADA Risk Categorization: Low Risk :  Patient has all of the following: Intact protective sensation No prior foot ulcer  No severe deformity Pedal pulses present  Plan: -Examined patient. -Diabetic foot examination performed on today's visit. -Continue diabetic foot care principles. -Patient to continue soft, supportive shoe gear daily. -Toenails 1-5 b/l were debrided in length and girth with sterile nail nippers and dremel without iatrogenic bleeding.  -Painful porokeratotic lesion(s) L hallux, L 2nd toe, R hallux, R 2nd toe and submet head 2 right foot pared and enucleated with sterile scalpel blade without incident. -Patient to report any pedal injuries to medical professional immediately. -Patient/POA to call should there be question/concern in the interim.  Return in about 3 months (around 07/09/2020).  Marzetta Board, DPM

## 2020-05-22 ENCOUNTER — Other Ambulatory Visit: Payer: Self-pay | Admitting: Family Medicine

## 2020-07-24 ENCOUNTER — Encounter: Payer: Self-pay | Admitting: Podiatry

## 2020-07-24 ENCOUNTER — Other Ambulatory Visit: Payer: Self-pay

## 2020-07-24 ENCOUNTER — Ambulatory Visit (INDEPENDENT_AMBULATORY_CARE_PROVIDER_SITE_OTHER): Payer: Medicare Other | Admitting: Podiatry

## 2020-07-24 DIAGNOSIS — M2011 Hallux valgus (acquired), right foot: Secondary | ICD-10-CM

## 2020-07-24 DIAGNOSIS — M79672 Pain in left foot: Secondary | ICD-10-CM | POA: Diagnosis not present

## 2020-07-24 DIAGNOSIS — Q828 Other specified congenital malformations of skin: Secondary | ICD-10-CM

## 2020-07-24 DIAGNOSIS — M2042 Other hammer toe(s) (acquired), left foot: Secondary | ICD-10-CM

## 2020-07-24 DIAGNOSIS — E119 Type 2 diabetes mellitus without complications: Secondary | ICD-10-CM

## 2020-07-24 DIAGNOSIS — M79671 Pain in right foot: Secondary | ICD-10-CM | POA: Diagnosis not present

## 2020-07-24 DIAGNOSIS — M2041 Other hammer toe(s) (acquired), right foot: Secondary | ICD-10-CM

## 2020-07-24 DIAGNOSIS — M2012 Hallux valgus (acquired), left foot: Secondary | ICD-10-CM

## 2020-07-29 NOTE — Progress Notes (Signed)
  Subjective:  Patient ID: Linda Livingston, female    DOB: 10-23-1965,  MRN: [id number]  Linda Livingston presents to clinic today for preventative diabetic foot care and painful porokeratotic lesions b/l feet.  Pain prevent comfortable ambulation. Aggravating factor is weightbearing with or without shoegear.   Patient states her blood glucose was 109 mg/dl this morning.  She voices no new pedal concerns on today's visit.   PCP is Dr. Alcus Dad and last visit was some time last year per patient recall.  No Known Allergies  Review of Systems: Negative except as noted in the HPI. Objective:   Constitutional Linda Livingston is a pleasant 55 y.o. African American female, WD, WN in NAD. AAO x 3.   Vascular Capillary refill time to digits immediate b/l. Palpable pedal pulses b/l LE. Pedal hair absent. Lower extremity skin temperature gradient within normal limits. No pain with calf compression b/l. No edema noted b/l lower extremities. No cyanosis or clubbing noted.  Neurologic Normal speech. Oriented to person, place, and time. Protective sensation intact 5/5 intact bilaterally with 10g monofilament b/l. Vibratory sensation intact b/l.  Dermatologic Pedal skin with normal turgor, texture and tone bilaterally. No open wounds bilaterally. No interdigital macerations bilaterally. Toenails 1-5 b/l well maintained with adequate length. No erythema, no edema, no drainage, no fluctuance. Porokeratotic lesion(s) L hallux, L 2nd toe, R hallux, R 2nd toe, submet head 2 right foot and 1st metatarsal head b/l lower extremities. No erythema, no edema, no drainage, no fluctuance.  Orthopedic: Normal muscle strength 5/5 to all lower extremity muscle groups bilaterally. No pain crepitus or joint limitation noted with ROM b/l. Hallux valgus with bunion deformity noted b/l lower extremities. Hammertoes noted to the 2-5 bilaterally.   Radiographs: None Assessment:   1. Porokeratosis   2. Pain in both feet    3. Hallux valgus, acquired, bilateral   4. Acquired hammertoes of both feet   5. Controlled type 2 diabetes mellitus without complication, without long-term current use of insulin ([place])    Plan:  Patient was evaluated and treated and all questions answered.  Onychomycosis with pain -Nails palliatively debridement as below -Educated on self-care  Procedure: Nail Debridement Rationale: Pain Type of Debridement: manual, sharp debridement. Instrumentation: Nail nipper, rotary burr. Number of Nails: 10 -Examined patient. -No new findings. No new orders. -Continue diabetic foot care principles. -Patient to continue soft, supportive shoe gear daily. -Painful porokeratotic lesion(s) L hallux, L 2nd toe, R hallux, R 2nd toe, submet head 2 right foot and 1st metatarsal head b/l lower extremities pared and enucleated with sterile scalpel blade without incident. Total number of lesions debrided=7. -Patient to report any pedal injuries to medical professional immediately. -Patient/POA to call should there be question/concern in the interim.  Return in about 3 months (around 10/24/2020).  Marzetta Board, DPM

## 2020-08-17 ENCOUNTER — Other Ambulatory Visit: Payer: Self-pay | Admitting: Family Medicine

## 2020-09-06 ENCOUNTER — Other Ambulatory Visit: Payer: Self-pay | Admitting: Family Medicine

## 2020-09-14 ENCOUNTER — Other Ambulatory Visit: Payer: Self-pay | Admitting: Family Medicine

## 2020-10-26 ENCOUNTER — Other Ambulatory Visit: Payer: Self-pay

## 2020-10-26 ENCOUNTER — Encounter: Payer: Self-pay | Admitting: Podiatry

## 2020-10-26 ENCOUNTER — Ambulatory Visit (INDEPENDENT_AMBULATORY_CARE_PROVIDER_SITE_OTHER): Payer: Medicare Other | Admitting: Podiatry

## 2020-10-26 DIAGNOSIS — E119 Type 2 diabetes mellitus without complications: Secondary | ICD-10-CM | POA: Diagnosis not present

## 2020-10-26 DIAGNOSIS — M79671 Pain in right foot: Secondary | ICD-10-CM

## 2020-10-26 DIAGNOSIS — Q828 Other specified congenital malformations of skin: Secondary | ICD-10-CM

## 2020-10-26 DIAGNOSIS — M79672 Pain in left foot: Secondary | ICD-10-CM | POA: Diagnosis not present

## 2020-10-30 NOTE — Progress Notes (Signed)
Subjective: Linda Livingston is a pleasant 55 y.o. female patient seen today for preventative diabetic foot care. She is seen for painful porokeratotic lesions plantar aspect of both feet. Pain interferes with ambulation. Aggravating factors include weightbearing with and without shoe gear. Pain is relieved with periodic professional debridement.  Patient states blood glucose was 103 mg/dl this morning. Not sure of her last A1c.  PCP is Alcus Dad, MD. Last visit was: 3 months ago per patient recall..  No Known Allergies  Objective: Physical Exam  General: Linda Livingston is a pleasant 55 y.o. African American female, in NAD. AAO x 3.   Vascular:  Capillary refill time to digits immediate b/l. Palpable DP pulse(s) b/l lower extremities Palpable PT pulse(s) b/l lower extremities Pedal hair absent. Lower extremity skin temperature gradient within normal limits. No pain with calf compression b/l. No edema noted b/l lower extremities.  Dermatological:  Skin warm and supple b/l lower extremities. No open wounds b/l lower extremities. No interdigital macerations b/l lower extremities. Toenails 1-5 bilaterally well maintained with adequate length. No erythema, no edema, no drainage, no fluctuance. Porokeratotic lesion(s) L hallux, L 2nd toe, R hallux, R 2nd toe, submet head 2 right foot, and 1st metatarsal head b/l lower extremities. No erythema, no edema, no drainage, no fluctuance.  Musculoskeletal:  Normal muscle strength 5/5 to all lower extremity muscle groups bilaterally. No pain crepitus or joint limitation noted with ROM b/l lower extremities. Hallux valgus with bunion deformity noted b/l lower extremities. Hammertoe(s) noted to the 2-5 bilaterally.  Neurological:  Protective sensation intact 5/5 intact bilaterally with 10g monofilament b/l. Vibratory sensation intact b/l.  Assessment and Plan:  1. Porokeratosis   2. Pain in both feet   3. Controlled type 2 diabetes mellitus  without complication, without long-term current use of insulin ([place])      -Examined patient. -No new findings. No new orders. -Continue diabetic foot care principles: inspect feet daily, monitor glucose as recommended by PCP and/or Endocrinologist, and follow prescribed diet per PCP, Endocrinologist and/or dietician. -Patient to continue soft, supportive shoe gear daily. -Painful porokeratotic lesion(s) L hallux, L 2nd toe, R hallux, R 2nd toe, submet head 2 right foot, and 1st metatarsal head b/l lower extremities pared and enucleated with sterile scalpel blade without incident. Total number of lesions debrided=7. -Patient to report any pedal injuries to medical professional immediately. -Patient/POA to call should there be question/concern in the interim.  Return in about 3 months (around 01/26/2021).  Marzetta Board, DPM

## 2020-12-05 ENCOUNTER — Other Ambulatory Visit: Payer: Self-pay | Admitting: Family Medicine

## 2020-12-18 ENCOUNTER — Other Ambulatory Visit: Payer: Self-pay | Admitting: Family Medicine

## 2020-12-18 DIAGNOSIS — Z1231 Encounter for screening mammogram for malignant neoplasm of breast: Secondary | ICD-10-CM

## 2020-12-19 ENCOUNTER — Other Ambulatory Visit: Payer: Self-pay

## 2020-12-19 ENCOUNTER — Ambulatory Visit
Admission: RE | Admit: 2020-12-19 | Discharge: 2020-12-19 | Disposition: A | Discharge status: Home / Self Care | Payer: Medicare Other | Source: Ambulatory Visit | Attending: Family Medicine | Admitting: Family Medicine

## 2020-12-19 DIAGNOSIS — Z1231 Encounter for screening mammogram for malignant neoplasm of breast: Secondary | ICD-10-CM | POA: Diagnosis not present

## 2021-01-28 ENCOUNTER — Encounter: Payer: Self-pay | Admitting: Podiatry

## 2021-01-28 ENCOUNTER — Other Ambulatory Visit: Payer: Self-pay

## 2021-01-28 ENCOUNTER — Ambulatory Visit (INDEPENDENT_AMBULATORY_CARE_PROVIDER_SITE_OTHER): Payer: Medicare Other | Admitting: Podiatry

## 2021-01-28 DIAGNOSIS — M79672 Pain in left foot: Secondary | ICD-10-CM

## 2021-01-28 DIAGNOSIS — E119 Type 2 diabetes mellitus without complications: Secondary | ICD-10-CM | POA: Diagnosis not present

## 2021-01-28 DIAGNOSIS — Q828 Other specified congenital malformations of skin: Secondary | ICD-10-CM | POA: Diagnosis not present

## 2021-01-28 DIAGNOSIS — M79671 Pain in right foot: Secondary | ICD-10-CM

## 2021-01-31 NOTE — Progress Notes (Signed)
  Subjective:  Patient ID: Linda Livingston, female    DOB: Jan 29, 1966,  MRN: [id number]  Linda Livingston presents to clinic today for preventative diabetic foot care and painful porokeratotic lesions of both feet.  Pain prevent comfortable ambulation. Aggravating factor is weightbearing with or without shoegear.  Patient states blood glucose was 122 mg/dl today.    PCP is Alcus Dad, MD , and last visit was May, 2022.  No Known Allergies  Review of Systems: Negative except as noted in the HPI. Objective:   Constitutional Linda Livingston is a pleasant 55 y.o. African American female, WD, WN in NAD. AAO x 3.   Vascular CFT immediate b/l LE. Palpable DP/PT pulses b/l LE. Digital hair absent b/l. Skin temperature gradient WNL b/l. No pain with calf compression b/l. No edema noted b/l. No cyanosis or clubbing noted b/l LE. No cyanosis or clubbing noted.  Neurologic Normal speech. Oriented to person, place, and time. Protective sensation intact 5/5 intact bilaterally with 10g monofilament b/l. Vibratory sensation intact b/l.  Dermatologic Pedal skin is warm and supple b/l LE. No open wounds b/l LE. No interdigital macerations noted b/l LE. Toenails 1-5 bilaterally well maintained with adequate length. No erythema, no edema, no drainage, no fluctuance. Porokeratotic lesion(s) bilateral great toes, L 2nd toe, R 2nd toe, submet head 2 right foot, and 1st metatarsal head b/l feet. No erythema, no edema, no drainage, no fluctuance.  Orthopedic: HAV with bunion deformity noted b/l LE. Hammertoe deformity noted 2-5 b/l.   Radiographs: None Assessment:   1. Porokeratosis   2. Pain in both feet   3. Controlled type 2 diabetes mellitus without complication, without long-term current use of insulin ([place])    Plan:  Patient was evaluated and treated and all questions answered. Consent given for treatment as described below: -Examined patient. -Continue diabetic foot care principles daily. -Painful  porokeratotic lesion(s) bilateral great toes, L 2nd toe, R 2nd toe, submet head 2 right foot, and 1st metatarsal head b/l lower extremities pared and enucleated with sterile scalpel blade without incident. Total number of lesions debrided=7. -Patient/POA to call should there be question/concern in the interim.  Return in about 3 months (around 04/30/2021).  Marzetta Board, DPM

## 2021-03-18 ENCOUNTER — Other Ambulatory Visit: Payer: Self-pay | Admitting: Family Medicine

## 2021-03-19 ENCOUNTER — Other Ambulatory Visit: Payer: Self-pay | Admitting: Family Medicine

## 2021-03-20 ENCOUNTER — Other Ambulatory Visit: Payer: Self-pay | Admitting: Family Medicine

## 2021-03-27 ENCOUNTER — Ambulatory Visit (INDEPENDENT_AMBULATORY_CARE_PROVIDER_SITE_OTHER): Payer: Commercial Managed Care - HMO

## 2021-03-27 ENCOUNTER — Encounter: Payer: Self-pay | Admitting: Family Medicine

## 2021-03-27 ENCOUNTER — Other Ambulatory Visit: Payer: Self-pay

## 2021-03-27 ENCOUNTER — Ambulatory Visit (INDEPENDENT_AMBULATORY_CARE_PROVIDER_SITE_OTHER): Payer: Commercial Managed Care - HMO | Admitting: Family Medicine

## 2021-03-27 DIAGNOSIS — Z23 Encounter for immunization: Secondary | ICD-10-CM | POA: Diagnosis not present

## 2021-03-27 DIAGNOSIS — E7841 Elevated Lipoprotein(a): Secondary | ICD-10-CM

## 2021-03-27 DIAGNOSIS — E119 Type 2 diabetes mellitus without complications: Secondary | ICD-10-CM

## 2021-03-27 DIAGNOSIS — Z1211 Encounter for screening for malignant neoplasm of colon: Secondary | ICD-10-CM

## 2021-03-27 LAB — POCT GLYCOSYLATED HEMOGLOBIN (HGB A1C): HbA1c, POC (controlled diabetic range): 6 % (ref 0.0–7.0)

## 2021-03-27 LAB — POCT UA - MICROALBUMIN
Albumin/Creatinine Ratio, Urine, POC: <30
Creatinine, POC: 100 mg/dL
Microalbumin Ur, POC: 10 mg/L

## 2021-03-27 MED ORDER — PRAVASTATIN SODIUM 40 MG PO TABS: ORAL_TABLET | ORAL | 1 refills | Status: DC

## 2021-03-27 NOTE — Progress Notes (Signed)
° ° °  SUBJECTIVE:   Chief compliant/HPI: annual examination  KRISTIANE MORSCH is a 56 y.o. who presents today for an annual exam.   Review of systems form notable for no new symptoms, patient does not have any issues with bowel/bladder, no chest pain or abdominal pain present.   Updated history tabs and problem list.   OBJECTIVE:   BP 121/66    Pulse 92    Ht $R'5\' 5"'eS$  (1.651 m)    Wt 180 lb 9.6 oz (81.9 kg)    SpO2 99%    BMI 30.05 kg/m   Gen: well-appearing, NAD CV: RRR, no m/r/g appreciated, no peripheral edema Pulm: CTAB, no wheezes/crackles GI: soft, non-tender, non-distended  ASSESSMENT/PLAN:   Type 2 diabetes mellitus without complication, without long-term current use of insulin (HCC) Chronic, well controlled on metformin.  A1c today 6%. - Follow-up in 6 months for A1c - Continue metformin 500 mg twice daily - Urine microalbumin/creatinine normal today - BMP collected - Lipid panel collected - Discussed need for ophthalmology exam    Annual Examination  See AVS for age appropriate recommendations.   PHQ score 0, reviewed and discussed. Blood pressure reviewed and at goal.  Asked about intimate partner violence and patient reports none.  The patient currently uses nothing for contraception. Folate recommended as appropriate, minimum of 400 mcg per day.  Advanced directives none-discussed today about thinking about POA and planning   Considered the following items based upon USPSTF recommendations: HIV testing: discussed Hepatitis C: discussed, patient reports that she thinks that she had this test completed by [place] [facility] but is unsure Hepatitis B: discussed Syphilis if at high risk:  not at high risk GC/CT not at high risk and not ordered. Lipid panel (nonfasting or fasting) discussed based upon AHA recommendations and ordered.  Consider repeat every 4-6 years.  Reviewed risk factors for latent tuberculosis and not indicated  Discussed family history, BRCA  testing undecided.  Cervical cancer screening: discussed and declined.  Do not have any records of Pap smears in the chart, unsure if patient ever had an abnormal Pap before.  Do recommend that patient get one soon and this was discussed with the patient. Immunizations COVID, flu, and pneumococcal vaccines given today.  We will need to discuss tetanus/Tdap and Shingrix Referral to GI placed for colonoscopy  Follow up in the next several months for Pap smear and A1c.   Rise Patience, [place]

## 2021-03-27 NOTE — Patient Instructions (Addendum)
It was so great seeing you today! Today we discussed the following:  - Colonoscopy referral was placed, please make sure to check any voicemails left on your phone for scheduling.   - I recommend using resistance bands to help with your exercise to continue to strengthen your bones.   - We recommend the following for healthcare maintenance.   Health Maintenance Due  Topic Date Due   OPHTHALMOLOGY EXAM  Never done   Hepatitis C Screening  Never done   TETANUS/TDAP  Never done   COLONOSCOPY (Pts 45-75yrs Insurance coverage will need to be confirmed)  Never done   Zoster Vaccines- Shingrix (1 of 2) Never done       Please make sure to bring any medications you take to your appointments. If you have any questions or concerns please call the office at [phone].   If any tests were collected today, I will notify you of results via MyChart, phone call, or letter. Please contact the office if you have not heard back about results within 2 weeks.

## 2021-03-27 NOTE — Assessment & Plan Note (Addendum)
Chronic, well controlled on metformin.  A1c today 6%. - Follow-up in 6 months for A1c - Continue metformin 500 mg twice daily - Urine microalbumin/creatinine normal today - BMP collected - Lipid panel collected - Discussed need for ophthalmology exam

## 2021-03-28 ENCOUNTER — Telehealth: Payer: Self-pay | Admitting: Family Medicine

## 2021-03-28 LAB — LIPID PANEL
Chol/HDL Ratio: 3 ratio (ref 0.0–4.4)
Cholesterol, Total: 203 mg/dL — ABNORMAL HIGH (ref 100–199)
HDL: 67 mg/dL (ref >39)
LDL Chol Calc (NIH): 124 mg/dL — ABNORMAL HIGH (ref 0–99)
Triglycerides: 65 mg/dL (ref 0–149)
VLDL Cholesterol Cal: 12 mg/dL (ref 5–40)

## 2021-03-28 LAB — BASIC METABOLIC PANEL
BUN/Creatinine Ratio: 23 (ref 9–23)
BUN: 19 mg/dL (ref 6–24)
CO2: 25 mmol/L (ref 20–29)
Calcium: 8.9 mg/dL (ref 8.7–10.2)
Chloride: 104 mmol/L (ref 96–106)
Creatinine, Ser: 0.84 mg/dL (ref 0.57–1.00)
Glucose: 114 mg/dL — ABNORMAL HIGH (ref 70–99)
Potassium: 4.1 mmol/L (ref 3.5–5.2)
Sodium: 142 mmol/L (ref 134–144)
eGFR: 82 mL/min/{1.73_m2} (ref >59)

## 2021-03-28 MED ORDER — ROSUVASTATIN CALCIUM 20 MG PO TABS: 20.0000 mg | ORAL_TABLET | Freq: Every day | ORAL | 3 refills | Status: DC

## 2021-03-28 NOTE — Telephone Encounter (Signed)
Called patient to discuss results, ASCVD risk is 10.3% (as outlined below) and patient is recommended for a high-intensity statin. Patient is currently on Pravastatin 40mg , which maximum dose is still only a medium-intensity.  The 10-year ASCVD risk score (Arnett DK, et al., 2019) is: 10.3%   Values used to calculate the score:     Age: 56 years     Sex: Female     Is Non-Hispanic African American: Yes     Diabetic: Yes     Tobacco smoker: Yes     Systolic Blood Pressure: 003 mmHg     Is BP treated: No     HDL Cholesterol: 67 mg/dL     Total Cholesterol: 203 mg/dL   Attempted to call the patient, but was unable to reach her and left a voicemail. We will switch her to Rosuvastatin 20mg  and patient was instructed to call the office back with questions and further instructions.      Linda Lal, DO

## 2021-04-28 ENCOUNTER — Encounter: Payer: Self-pay | Admitting: Family Medicine

## 2021-05-06 ENCOUNTER — Encounter: Payer: Self-pay | Admitting: Podiatry

## 2021-05-06 ENCOUNTER — Ambulatory Visit (INDEPENDENT_AMBULATORY_CARE_PROVIDER_SITE_OTHER): Payer: Medicare Other | Admitting: Podiatry

## 2021-05-06 ENCOUNTER — Other Ambulatory Visit: Payer: Self-pay

## 2021-05-06 DIAGNOSIS — M79671 Pain in right foot: Secondary | ICD-10-CM | POA: Diagnosis not present

## 2021-05-06 DIAGNOSIS — E119 Type 2 diabetes mellitus without complications: Secondary | ICD-10-CM

## 2021-05-06 DIAGNOSIS — M79672 Pain in left foot: Secondary | ICD-10-CM | POA: Diagnosis not present

## 2021-05-06 DIAGNOSIS — M2042 Other hammer toe(s) (acquired), left foot: Secondary | ICD-10-CM

## 2021-05-06 DIAGNOSIS — M2012 Hallux valgus (acquired), left foot: Secondary | ICD-10-CM | POA: Diagnosis not present

## 2021-05-06 DIAGNOSIS — M2011 Hallux valgus (acquired), right foot: Secondary | ICD-10-CM

## 2021-05-06 DIAGNOSIS — Q828 Other specified congenital malformations of skin: Secondary | ICD-10-CM

## 2021-05-06 DIAGNOSIS — M2041 Other hammer toe(s) (acquired), right foot: Secondary | ICD-10-CM | POA: Diagnosis not present

## 2021-05-07 ENCOUNTER — Encounter: Payer: Self-pay | Admitting: Family Medicine

## 2021-05-12 NOTE — Progress Notes (Signed)
ANNUAL DIABETIC FOOT EXAM  Subjective: Linda Livingston presents today for annual diabetic foot examination.  Patient relates 3 year h/o diabetes.  Patient denies any h/o foot wounds.  Patient denies any numbness, tingling, burning, or pins/needle sensation in feet.  Patient's blood sugar was 123 mg/dl today.   Risk factors: diabetes, hyperlipidemia, current tobacco user.  Alcus Dad, MD is patient's PCP. Last visit was January, 2023.  Past Medical History:  Diagnosis Date   Diabetes mellitus without complication [facility])    Patient Active Problem List   Diagnosis Date Noted   Callus of foot 01/21/2018   Valgus deformity of both great toes 01/21/2018   Need for immunization against influenza 12/02/2017   Hyperlipidemia 12/02/2017   Type 2 diabetes mellitus without complication, without long-term current use of insulin ([place]) 10/05/2017   Elevated lipoprotein(a) 10/05/2017   Health maintenance examination 09/08/2017   Pre-ulcerative corn or callous 09/08/2017   Glaucoma suspect of both eyes 03/26/2017   Congenital hypertrophy of retinal pigment epithelium 03/26/2017   History reviewed. No pertinent surgical history. Current Outpatient Medications on File Prior to Visit  Medication Sig Dispense Refill   Blood Glucose Monitoring Suppl (ONETOUCH VERIO) w/Device KIT 1 Units by Does not apply route daily. 1 kit 0   FLUCELVAX QUADRIVALENT 0.5 ML injection      Lancet Devices (ONE TOUCH DELICA LANCING DEV) MISC 1 Units by Does not apply route daily. 1 each 1   latanoprost (XALATAN) 0.005 % ophthalmic solution INT 1 GTT IN OU HS  6   metFORMIN (GLUCOPHAGE) 500 MG tablet TAKE 1 TABLET(500 MG) BY MOUTH TWICE DAILY WITH A MEAL 180 tablet 3   OneTouch Delica Lancets 26Z MISC 1 Units by Does not apply route 4 (four) times daily -  before meals and at bedtime for 30 days. 120 each 11   ONETOUCH VERIO test strip TEST 4 TIMES DAILY BEFORE MEALS AND BEDTIME 100 strip 11   rosuvastatin  (CRESTOR) 20 MG tablet Take 1 tablet (20 mg total) by mouth daily. 90 tablet 3   No current facility-administered medications on file prior to visit.    No Known Allergies Social History   Occupational History   Not on file  Tobacco Use   Smoking status: Some Days   Smokeless tobacco: Never  Substance and Sexual Activity   Alcohol use: Yes    Alcohol/week: 1.0 standard drink    Types: 1 Cans of beer per week    Comment: occas   Drug use: Never   Sexual activity: Not on file   Family History  Problem Relation Age of Onset   Breast cancer Neg Hx    Immunization History  Administered Date(s) Administered   Influenza,inj,Quad PF,6+ Mos 12/02/2017, 03/27/2021   PFIZER Comirnaty(Gray Top)Covid-19 Tri-Sucrose Vaccine 06/17/2019, 07/08/2019, 02/01/2020   PNEUMOCOCCAL CONJUGATE-20 03/27/2021   Pfizer Covid-19 Vaccine Bivalent Booster 82yr & up 03/27/2021   Pneumococcal Polysaccharide-23 12/02/2017     Review of Systems: Negative except as noted in the HPI.   Objective: There were no vitals filed for this visit.  Linda GALVAOis a pleasant 56y.o. female in NAD. AAO X 3.  Vascular Examination: Capillary refill time to digits immediate b/l. Palpable DP pulse(s) b/l LE. Palpable PT pulse(s) b/l LE. Pedal hair absent. No pain with calf compression b/l. Lower extremity skin temperature gradient within normal limits. No edema noted b/l LE. No ischemia or gangrene noted b/l LE. No cyanosis or clubbing noted b/l LE.  Dermatological  Examination: Pedal integument with normal turgor, texture and tone BLE. No open wounds b/l LE. No interdigital macerations noted b/l LE. Toenails 1-5 b/l elongated, discolored, dystrophic, thickened, crumbly with subungual debris and tenderness to dorsal palpation. Preulcerative porokeratotic lesion(s) L hallux, L 2nd toe, R hallux, R 2nd toe, submet head 2 right foot, and 1st metatarsal head b/l lower extremities. No erythema, no edema, no drainage, no  fluctuance.  Neurological Examination: Protective sensation intact 5/5 intact bilaterally with 10g monofilament b/l. Vibratory sensation intact b/l.  Musculoskeletal Examination: Normal muscle strength 5/5 to all lower extremity muscle groups bilaterally. HAV with bunion deformity noted b/l LE. Hammertoe deformity noted 2-5 b/l.Marland Kitchen No pain, crepitus or joint limitation noted with ROM b/l LE.  Patient ambulates independently without assistive aids.  Footwear Assessment: Does the patient wear appropriate shoes? Wearing house slippers on today's visit.. Does the patient need inserts/orthotics? Yes.  Hemoglobin A1C Latest Ref Rng & Units 03/27/2021  HGBA1C 0.0 - 7.0 % 6.0  Some recent data might be hidden   Assessment: 1. Porokeratosis   2. Pain in both feet   3. Hallux valgus, acquired, bilateral   4. Acquired hammertoes of both feet   5. Type 2 diabetes mellitus without complication, without long-term current use of insulin ([place])   6. Encounter for diabetic foot exam ([place])      ADA Risk Categorization: High Risk  Patient has one or more of the following: Loss of protective sensation Absent pedal pulses Severe Foot deformity History of foot ulcer  Plan: -Diabetic foot examination performed today. -Continue foot and shoe inspections daily. Monitor blood glucose per PCP/Endocrinologist's recommendations. -Toenails 1-5 b/l were debrided in length and girth with sterile nail nippers and dremel without iatrogenic bleeding.  -Painful preulcerative porokeratotic lesion(s) L hallux, L 2nd toe, R hallux, R 2nd toe, submet head 2 right foot, and 1st metatarsal head b/l lower extremities pared and enucleated with sterile scalpel blade without incident. Total number of lesions debrided=7. -Patient/POA to call should there be question/concern in the interim. Return in about 9 weeks (around 07/08/2021).  Marzetta Board, DPM

## 2021-07-12 ENCOUNTER — Ambulatory Visit (INDEPENDENT_AMBULATORY_CARE_PROVIDER_SITE_OTHER): Payer: Medicare Other | Admitting: Podiatry

## 2021-07-12 ENCOUNTER — Encounter: Payer: Self-pay | Admitting: Podiatry

## 2021-07-12 ENCOUNTER — Ambulatory Visit: Payer: Medicare Other | Admitting: Podiatry

## 2021-07-12 DIAGNOSIS — M2041 Other hammer toe(s) (acquired), right foot: Secondary | ICD-10-CM

## 2021-07-12 DIAGNOSIS — M79671 Pain in right foot: Secondary | ICD-10-CM

## 2021-07-12 DIAGNOSIS — Q828 Other specified congenital malformations of skin: Secondary | ICD-10-CM | POA: Diagnosis not present

## 2021-07-12 DIAGNOSIS — M2011 Hallux valgus (acquired), right foot: Secondary | ICD-10-CM

## 2021-07-12 DIAGNOSIS — E119 Type 2 diabetes mellitus without complications: Secondary | ICD-10-CM

## 2021-07-12 NOTE — Progress Notes (Signed)
This patient returns to my office for at risk foot care.  This patient requires this care by a professional since this patient will be at risk due to having diabetes.  This patient is unable to cut callus herself  since the patient cannot reach her nails.These nails are painful walking and wearing shoes.  This patient presents for at risk foot care today. ? ?General Appearance  Alert, conversant and in no acute stress. ? ?Vascular  Dorsalis pedis and posterior tibial  pulses are palpable  bilaterally.  Capillary return is within normal limits  bilaterally. Temperature is within normal limits  bilaterally. ? ?Neurologic  Senn-Weinstein monofilament wire test within normal limits  bilaterally. Muscle power within normal limits bilaterally. ? ?Nails Normotropic nails  B/L. bilaterally. No evidence of bacterial infection or drainage bilaterally. ? ?Orthopedic  No limitations of motion  feet .  No crepitus or effusions noted.  HAV  B/L.  Hammer toes 2-4  B/L. ? ?Skin  normotropic skin noted bilaterally.  No signs of infections or ulcers noted.  Pinch callus  B/L.  Porokeratosis sub 2  right foot.  Porokeratosis sub 1  B/L.   ? ?Onychomycosis  Pain in right toes  Pain in left toes ? ?Consent was obtained for treatment procedures.   Debrided callus with # 15 blade and dremel tool.  Filed with dremel without incident.  ? ? ?Return office visit  9 weeks.                  Discussed possible surgical correction. Told patient to return for periodic foot care and evaluation due to potential at risk complications. ? ? ?Gardiner Barefoot DPM   ?

## 2021-08-07 ENCOUNTER — Other Ambulatory Visit: Payer: Self-pay | Admitting: Family Medicine

## 2021-08-20 ENCOUNTER — Encounter: Payer: Self-pay | Admitting: *Deleted

## 2021-08-26 ENCOUNTER — Other Ambulatory Visit: Payer: Self-pay | Admitting: Family Medicine

## 2021-09-20 ENCOUNTER — Encounter: Payer: Self-pay | Admitting: Podiatry

## 2021-09-20 ENCOUNTER — Ambulatory Visit (INDEPENDENT_AMBULATORY_CARE_PROVIDER_SITE_OTHER): Payer: Commercial Managed Care - HMO | Admitting: Podiatry

## 2021-09-20 DIAGNOSIS — E1142 Type 2 diabetes mellitus with diabetic polyneuropathy: Secondary | ICD-10-CM

## 2021-09-20 DIAGNOSIS — E119 Type 2 diabetes mellitus without complications: Secondary | ICD-10-CM

## 2021-09-20 DIAGNOSIS — M792 Neuralgia and neuritis, unspecified: Secondary | ICD-10-CM

## 2021-09-20 DIAGNOSIS — Q828 Other specified congenital malformations of skin: Secondary | ICD-10-CM

## 2021-09-20 NOTE — Patient Instructions (Signed)
Purchase Nervive Cream for neuropathy pain and apply to feet before bedtime per package instructions.

## 2021-09-27 NOTE — Progress Notes (Signed)
  Subjective:  Patient ID: Linda Livingston, female    DOB: Dec 21, 1965,  MRN: [id number]  Linda Livingston presents to clinic today for painful porokeratotic lesions b/l lower extremities. Pain prevent(s) comfortable ambulation. Aggravating factor is weightbearing with and without shoegear.  Patient states blood glucose was 125 mg/dl today. Last known HgA1c was unknown.   New problem(s): None.   PCP is Alcus Dad, MD , and last visit was two weeks ago.  No Known Allergies  Review of Systems: Negative except as noted in the HPI.  Objective: No changes noted in today's physical examination.  Linda Livingston is a pleasant 56 y.o. female in NAD. AAO X 3.  Vascular Examination: Capillary refill time to digits immediate b/l. Palpable DP pulse(s) b/l LE. Palpable PT pulse(s) b/l LE. Pedal hair absent. No pain with calf compression b/l. Lower extremity skin temperature gradient within normal limits. No edema noted b/l LE. No ischemia or gangrene noted b/l LE. No cyanosis or clubbing noted b/l LE.  Dermatological Examination: Pedal integument with normal turgor, texture and tone BLE. No open wounds b/l LE. No interdigital macerations noted b/l LE. Toenails 1-5 recently debrided. Preulcerative porokeratotic lesion(s) bilateral great toes, bilateral 2nd toes, submet head 2 right foot, and 1st metatarsal head b/l lower extremities. No erythema, no edema, no drainage, no fluctuance.  Neurological Examination: Protective sensation intact 5/5 intact bilaterally with 10g monofilament b/l. Vibratory sensation intact b/l.  Musculoskeletal Examination: Normal muscle strength 5/5 to all lower extremity muscle groups bilaterally. HAV with bunion deformity noted b/l LE. Hammertoe deformity noted 2-5 b/l.Marland Kitchen No pain, crepitus or joint limitation noted with ROM b/l LE.  Patient ambulates independently without assistive aids.     Latest Ref Rng & Units 03/27/2021    9:10 AM  Hemoglobin A1C  Hemoglobin-A1c  0.0 - 7.0 % 6.0    Assessment/Plan: 1. Porokeratosis   2. Neuropathic pain   3. Diabetic peripheral neuropathy associated with type 2 diabetes mellitus ([place])     -Patient was evaluated and treated. All patient's and/or POA's questions/concerns answered on today's visit. -Medicaid ABN signed for services of paring of corn(s)/callus(es)/porokeratos(es) today. Copy in patient chart. -Patient to continue soft, supportive shoe gear daily. -Preulcerative porokeratotic lesion(s) bilateral great toes, bilateral 2nd toes, submet head 2 right foot, and 1st metatarsal head b/l lower extremities pared and enucleated with sterile scalpel blade without incident. Total number of lesions debrided=7. -Discussed neuropathy pain. Recommended Nervive cream or roll on to apply to feet before bedtime. -Patient/POA to call should there be question/concern in the interim.   Return in about 9 weeks (around 11/22/2021).  Marzetta Board, DPM

## 2021-10-16 ENCOUNTER — Ambulatory Visit: Payer: Medicare Other | Admitting: Podiatry

## 2021-12-06 ENCOUNTER — Ambulatory Visit (INDEPENDENT_AMBULATORY_CARE_PROVIDER_SITE_OTHER): Payer: Medicare Other | Admitting: Podiatry

## 2021-12-06 DIAGNOSIS — E1142 Type 2 diabetes mellitus with diabetic polyneuropathy: Secondary | ICD-10-CM | POA: Diagnosis not present

## 2021-12-06 DIAGNOSIS — Q828 Other specified congenital malformations of skin: Secondary | ICD-10-CM

## 2021-12-13 ENCOUNTER — Encounter: Payer: Self-pay | Admitting: Podiatry

## 2021-12-13 NOTE — Progress Notes (Signed)
  Subjective:  Patient ID: CADEN FATICA, female    DOB: 1965-08-19,  MRN: [id number]  SHIRA BOBST presents to clinic today for at risk foot care with history of diabetic neuropathy and corn(s)  b/l lower extremities, porokeratotic lesion(s) b/l lower extremities. Aggravating factors include wearing enclosed shoe gear. Pain is relieved with periodic professional debridement. Painful corns and porokeratotic lesion(s) aggravated when weightbearing with and without shoegear. Pain is relieved with periodic professional debridement.  Patient states blood glucose was 110 mg/dl today.  Last known  HgA1c was 6.0%.    New problem(s): None.   PCP is Alcus Dad, MD , and last visit was  March 27, 2021.  No Known Allergies  Review of Systems: Negative except as noted in the HPI.  Objective: No changes noted in today's physical examination.  JANNETTA MASSEY is a pleasant 56 y.o. female WD, WN in NAD. AAO x 3.  Vascular Examination: Capillary refill time to digits immediate b/l. Palpable DP pulse(s) b/l LE. Palpable PT pulse(s) b/l LE. Pedal hair absent. No pain with calf compression b/l. Lower extremity skin temperature gradient within normal limits. No edema noted b/l LE. No ischemia or gangrene noted b/l LE. No cyanosis or clubbing noted b/l LE.  Dermatological Examination: Pedal integument with normal turgor, texture and tone BLE. No open wounds b/l LE. No interdigital macerations noted b/l LE. Toenails 1-5 well maintained.Preulcerative porokeratotic lesion(s) bilateral great toes, bilateral 2nd toes, submet head 2 right foot, and 1st metatarsal head b/l lower extremities and submet head 5 left foot. No erythema, no edema, no drainage, no fluctuance.  Neurological Examination: Pt has subjective symptoms of neuropathy. Protective sensation intact 5/5 intact bilaterally with 10g monofilament b/l. Vibratory sensation intact b/l.  Musculoskeletal Examination: Normal muscle strength 5/5  to all lower extremity muscle groups bilaterally. HAV with bunion deformity noted b/l LE. Hammertoe deformity noted 2-5 b/l.Marland Kitchen No pain, crepitus or joint limitation noted with ROM b/l LE.  Patient ambulates independently without assistive aids.  Assessment/Plan: 1. Porokeratosis   2. Diabetic peripheral neuropathy associated with type 2 diabetes mellitus ([place])     No orders of the defined types were placed in this encounter. -Patient was evaluated and treated. All patient's and/or POA's questions/concerns answered on today's visit. -Medicaid ABN on file for paring of corn(s)/callus(es)/porokeratos(es). Copy in patient chart. -Continue foot and shoe inspections daily. Monitor blood glucose per PCP/Endocrinologist's recommendations. -Porokeratotic lesion(s) distal tip of left 2nd toe, distal tip of right 2nd toe, plantar IPJ of left great toe, plantar IPJ of right great toe, submet head 2 right foot, submet head 5 left foot, and 1st metatarsal head b/l lower extremities pared and enucleated with sterile currette without incident. Total number of lesions debrided=8. -Patient/POA to call should there be question/concern in the interim.   Return in about 9 weeks (around 02/07/2022).  Marzetta Board, DPM

## 2022-02-06 IMAGING — MG MM DIGITAL SCREENING BILAT W/ TOMO AND CAD
6 of 10 series · 6 of 30 positions shown · non-contrast
Comparison: None.

ACR Breast Density Category a: The breast tissue is almost entirely
fatty.

CLINICAL DATA: Screening.

EXAM:
DIGITAL SCREENING BILATERAL MAMMOGRAM WITH TOMOSYNTHESIS AND CAD
TECHNIQUE: Bilateral screening digital craniocaudal and mediolateral oblique
mammograms were obtained. Bilateral screening digital breast
tomosynthesis was performed. The images were evaluated with
computer-aided detection.

[L MLO synth-2D]
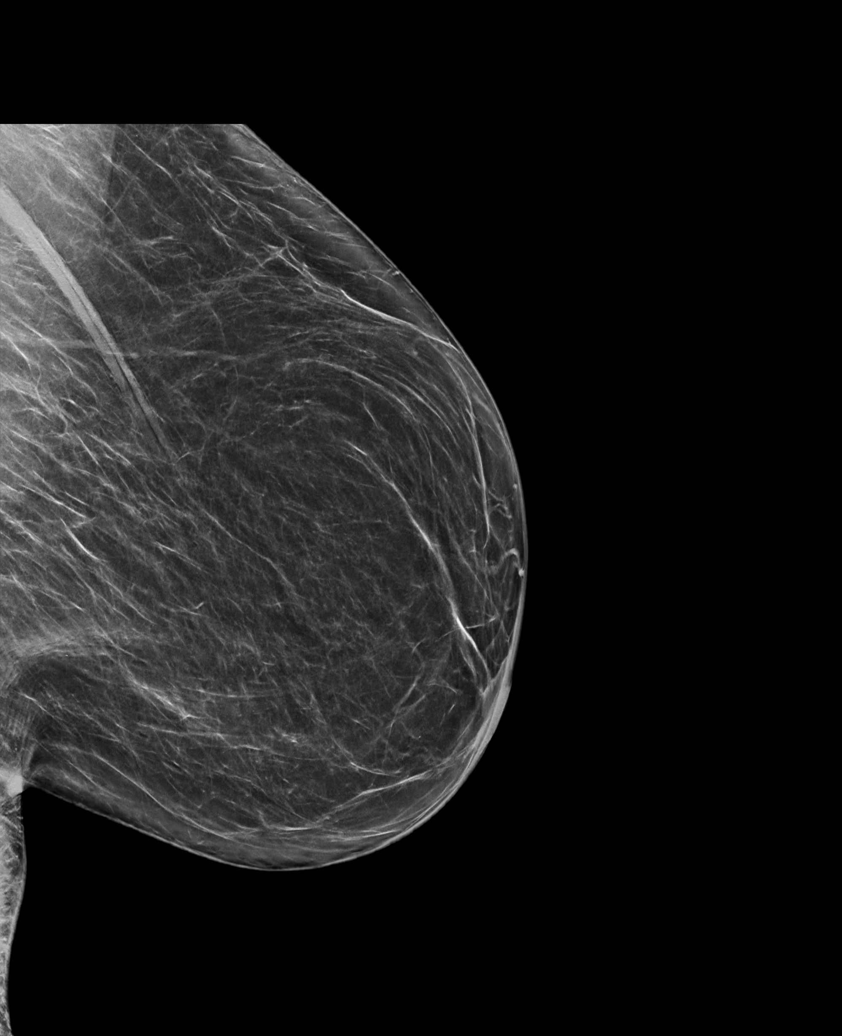

[L CC synth-2D (1 of 2)]
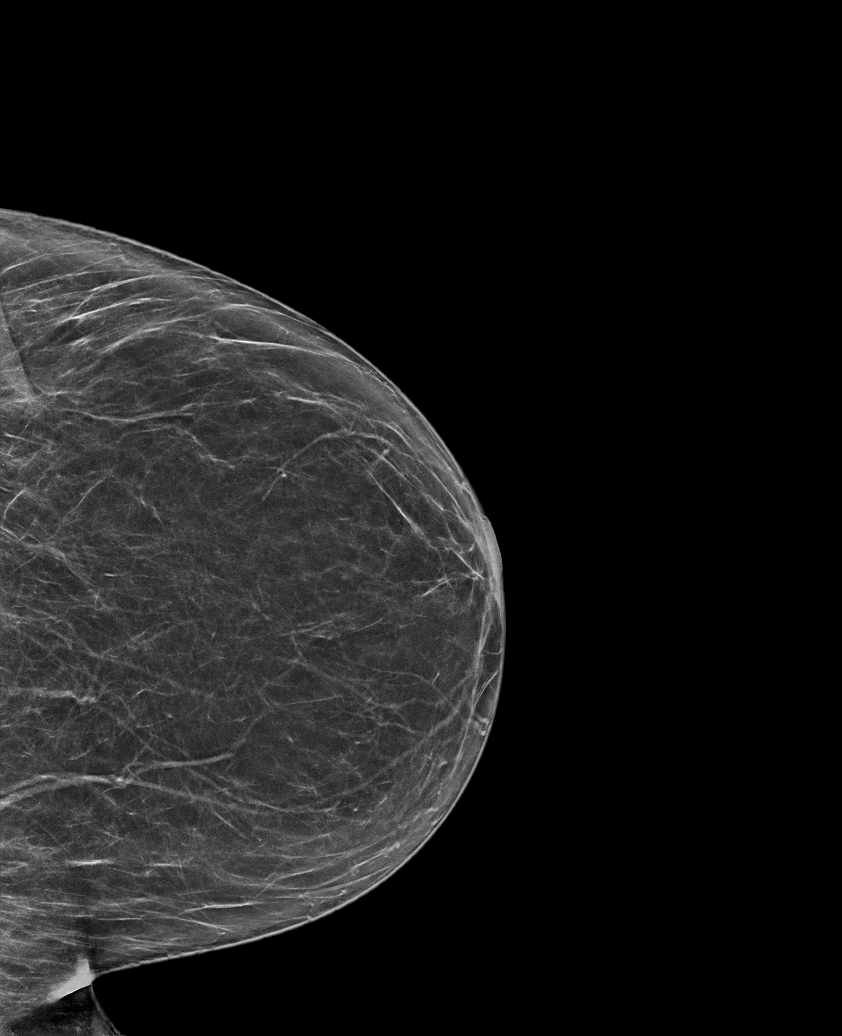

[R MLO synth-2D]
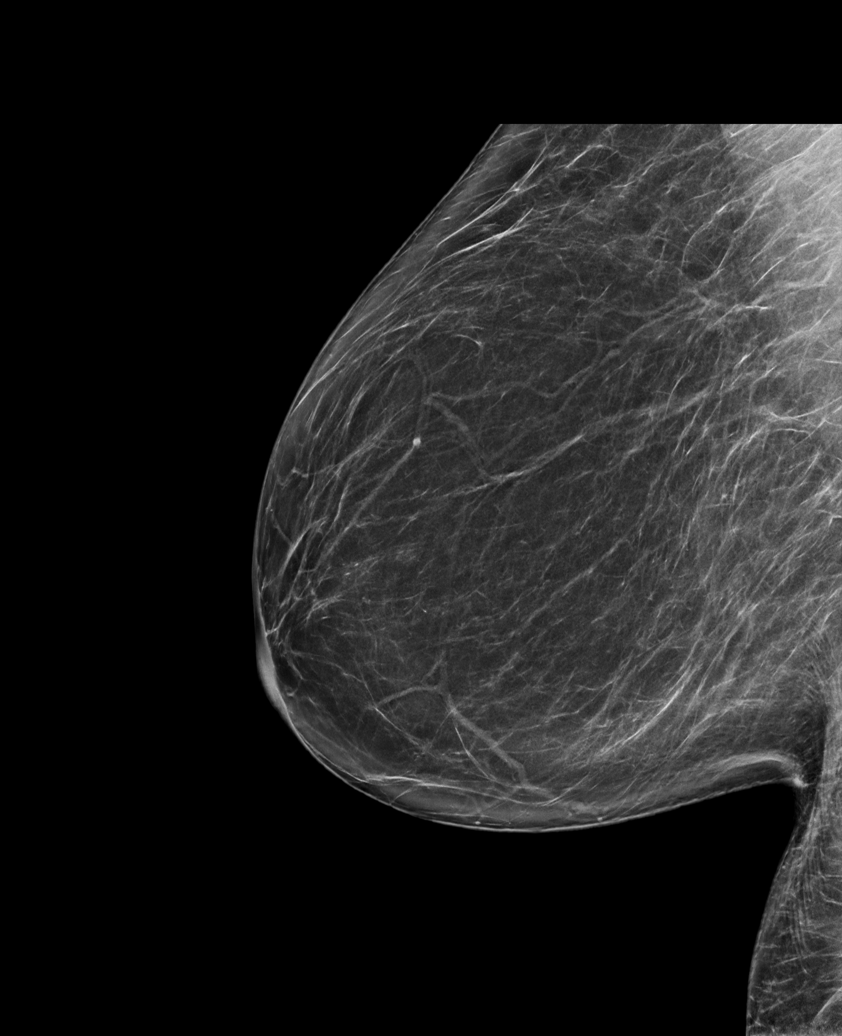

[L CC synth-2D (2 of 2)]
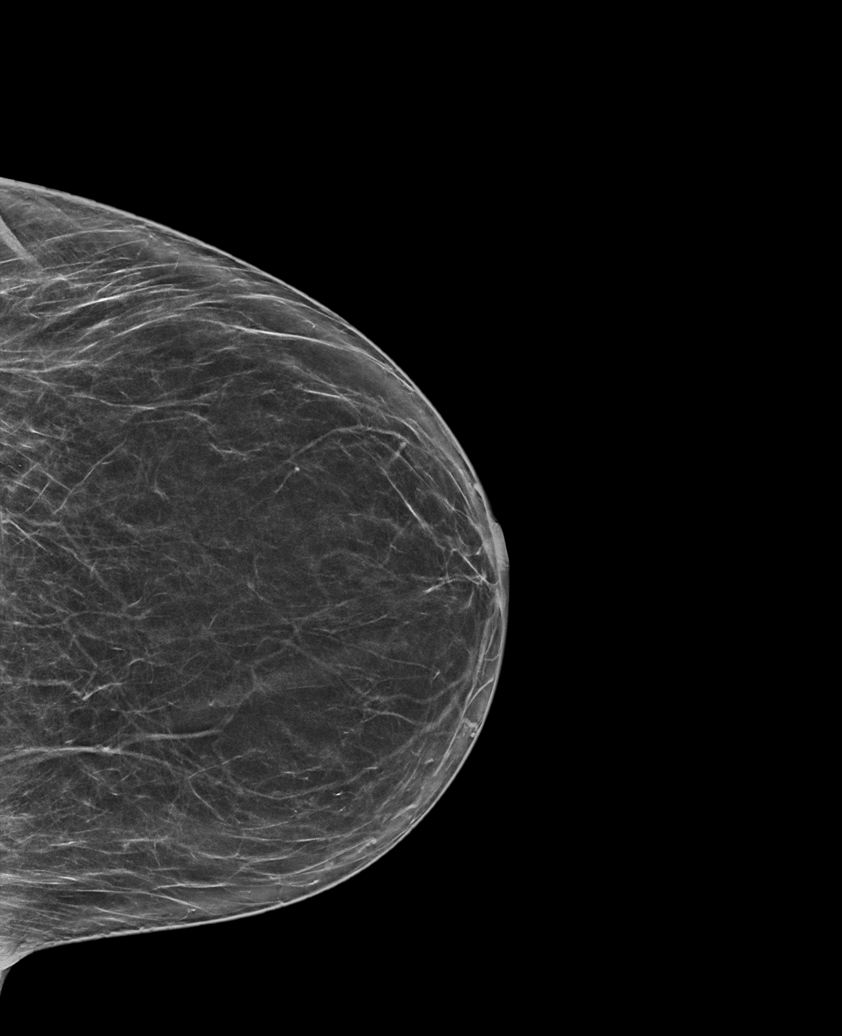

[R CC synth-2D]
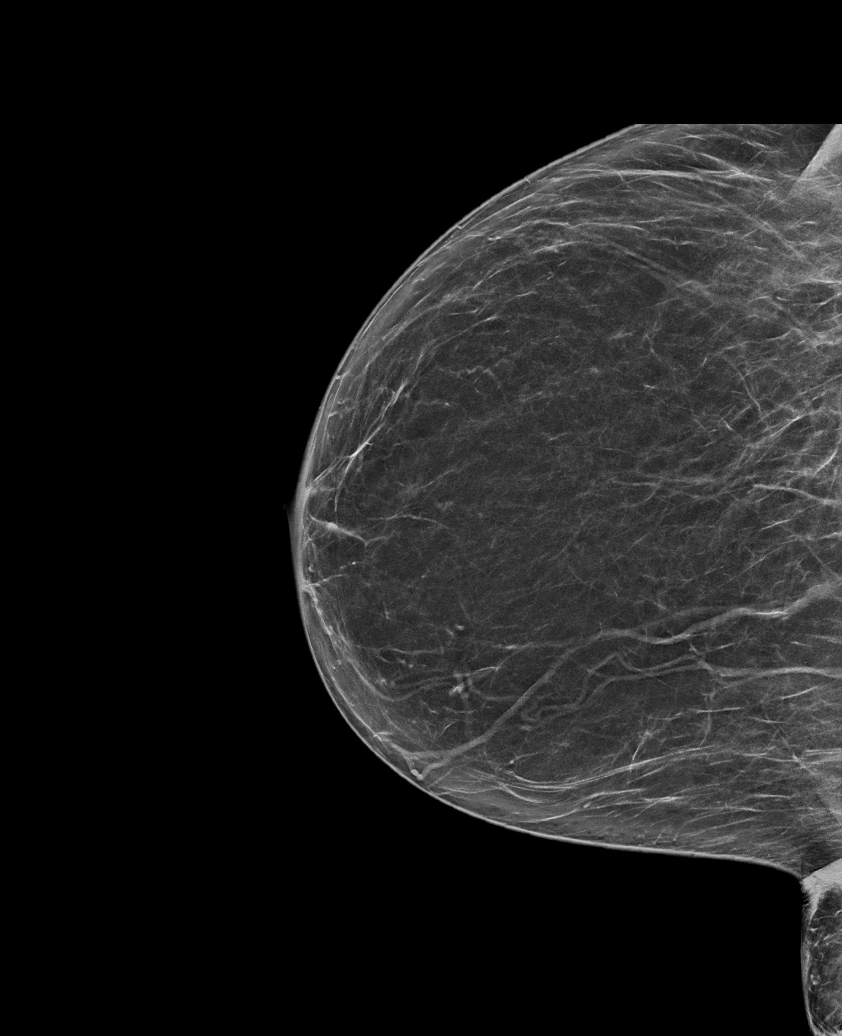

[L CC tomo · tomo slice 36/71.0]
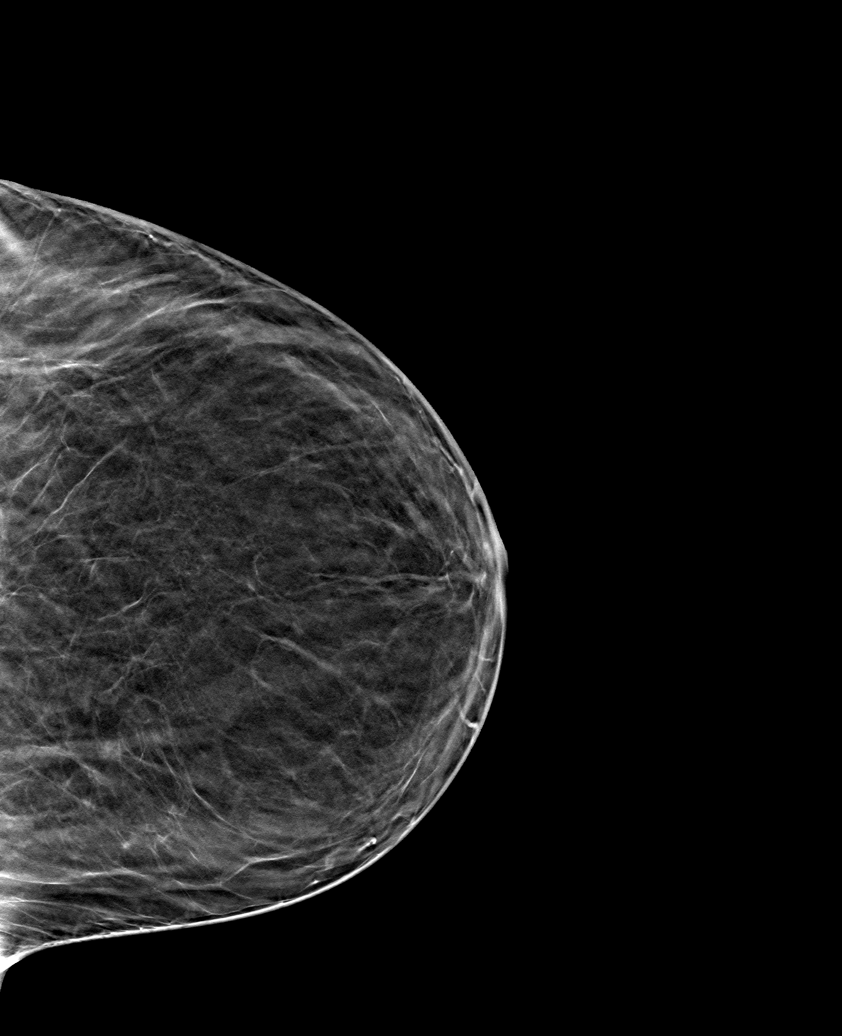

[6 of 30 positions shown; findings below may reference images not displayed]

FINDINGS: There are no findings suspicious for malignancy.
IMPRESSION: No mammographic evidence of malignancy. A result letter of this
screening mammogram will be mailed directly to the patient.

RECOMMENDATION:
Screening mammogram in one year. (Code:44-M-M6Q)

BI-RADS CATEGORY  1: Negative.

## 2022-02-14 ENCOUNTER — Ambulatory Visit: Payer: Commercial Managed Care - HMO | Admitting: Podiatry

## 2022-04-01 ENCOUNTER — Other Ambulatory Visit: Payer: Self-pay | Admitting: Family Medicine

## 2022-04-14 ENCOUNTER — Ambulatory Visit (INDEPENDENT_AMBULATORY_CARE_PROVIDER_SITE_OTHER): Payer: 59 | Admitting: Podiatry

## 2022-04-14 ENCOUNTER — Encounter: Payer: Self-pay | Admitting: Podiatry

## 2022-04-14 VITALS — BP 156/86 | HR 83

## 2022-04-14 DIAGNOSIS — Q828 Other specified congenital malformations of skin: Secondary | ICD-10-CM | POA: Diagnosis not present

## 2022-04-14 DIAGNOSIS — E1142 Type 2 diabetes mellitus with diabetic polyneuropathy: Secondary | ICD-10-CM | POA: Diagnosis not present

## 2022-04-14 NOTE — Progress Notes (Signed)
  Subjective:  Patient ID: Linda Livingston, female    DOB: [birth year],   MRN: [id number]  Chief Complaint  Patient presents with   Diabetes    Diabetic foot care, bilateral callus, A1c- 12.4     57 y.o. female presents for concern of thickened elongated and painful nails that are difficult to trim. Requesting to have them trimmed today. Relates burning and tingling in their feet. Patient is diabetic and last A1c was  Lab Results  Component Value Date   HGBA1C 6.0 03/27/2021   .   PCP:  Alcus Dad, MD    . Denies any other pedal complaints. Denies n/v/f/c.   Past Medical History:  Diagnosis Date   Diabetes mellitus without complication (HCC)     Objective:  Physical Exam: Vascular: DP/PT pulses 2/4 bilateral. CFT <3 seconds. Absent hair growth on digits. Edema noted to bilateral lower extremities. Xerosis noted bilaterally.  Skin. No lacerations or abrasions bilateral feet. Hyperkeratotic lesions noted sub fifth and first metatarsals bilateral as well as sub second digit on right. And medial first MPJ.   Musculoskeletal: MMT 5/5 bilateral lower extremities in DF, PF, Inversion and Eversion. Deceased ROM in DF of ankle joint.  Neurological: Sensation intact to light touch. Protective sensation diminished bilateral.    Assessment:   1. Porokeratosis   2. Diabetic peripheral neuropathy associated with type 2 diabetes mellitus ([place])      Plan:  Patient was evaluated and treated and all questions answered. -Discussed and educated patient on diabetic foot care, especially with  regards to the vascular, neurological and musculoskeletal systems.  -Stressed the importance of good glycemic control and the detriment of not  controlling glucose levels in relation to the foot. -Discussed supportive shoes at all times and checking feet regularly.  -Mechanically debrided all hyperkeratotic lesions bilateral using sterile chisel a-Answered all patient questions -Patient to return  in  3 months for at risk foot care -Patient advised to call the office if any problems or questions arise in the meantime.    Lorenda Peck, DPM

## 2022-04-18 ENCOUNTER — Ambulatory Visit: Payer: 59 | Admitting: Podiatry

## 2022-06-03 ENCOUNTER — Other Ambulatory Visit: Payer: Self-pay | Admitting: Family Medicine

## 2022-07-22 ENCOUNTER — Ambulatory Visit (INDEPENDENT_AMBULATORY_CARE_PROVIDER_SITE_OTHER): Payer: 59 | Admitting: Podiatry

## 2022-07-22 ENCOUNTER — Encounter: Payer: Self-pay | Admitting: Podiatry

## 2022-07-22 DIAGNOSIS — E119 Type 2 diabetes mellitus without complications: Secondary | ICD-10-CM

## 2022-07-22 DIAGNOSIS — M2011 Hallux valgus (acquired), right foot: Secondary | ICD-10-CM | POA: Diagnosis not present

## 2022-07-22 DIAGNOSIS — M2041 Other hammer toe(s) (acquired), right foot: Secondary | ICD-10-CM | POA: Diagnosis not present

## 2022-07-22 DIAGNOSIS — M2012 Hallux valgus (acquired), left foot: Secondary | ICD-10-CM

## 2022-07-22 DIAGNOSIS — E1142 Type 2 diabetes mellitus with diabetic polyneuropathy: Secondary | ICD-10-CM | POA: Diagnosis not present

## 2022-07-22 DIAGNOSIS — Q828 Other specified congenital malformations of skin: Secondary | ICD-10-CM

## 2022-07-22 DIAGNOSIS — M2042 Other hammer toe(s) (acquired), left foot: Secondary | ICD-10-CM

## 2022-07-26 NOTE — Progress Notes (Signed)
ANNUAL DIABETIC FOOT EXAM  Subjective: Linda Livingston presents today annual diabetic foot exam.  Chief Complaint  Patient presents with   Nail Problem   Callouses    Callus BS-122 A1C-6.5 PCP-[facility] PCP VST-2023    Patient confirms h/o diabetes.  Patient denies any h/o foot wounds.  Patient denies any numbness, tingling, burning, or pins/needle sensation in feet.  Patient endorses symptoms of foot numbness.   Patient endorses symptoms of foot tingling.  Patient endorses symptoms of burning in feet.  Patient endorses symptoms of pins/needles sensation in feet.  Patient has been diagnosed with neuropathy.  Risk factors: diabetes, diabetic neuropathy, foot deformity, hyperlipidemia, current tobacco user.  Maury Dus, MD is patient's PCP.  Past Medical History:  Diagnosis Date   Diabetes mellitus without complication [facility])    Patient Active Problem List   Diagnosis Date Noted   Callus of foot 01/21/2018   Valgus deformity of both great toes 01/21/2018   Need for immunization against influenza 12/02/2017   Hyperlipidemia 12/02/2017   Type 2 diabetes mellitus without complication, without long-term current use of insulin (HCC) 10/05/2017   Elevated lipoprotein(a) 10/05/2017   Health maintenance examination 09/08/2017   Pre-ulcerative corn or callous 09/08/2017   Glaucoma suspect of both eyes 03/26/2017   Congenital hypertrophy of retinal pigment epithelium 03/26/2017   History reviewed. No pertinent surgical history. Current Outpatient Medications on File Prior to Visit  Medication Sig Dispense Refill   Blood Glucose Monitoring Suppl (ONETOUCH VERIO) w/Device KIT 1 Units by Does not apply route daily. 1 kit 0   FLUCELVAX QUADRIVALENT 0.5 ML injection      Lancet Devices (ONE TOUCH DELICA LANCING DEV) MISC 1 Units by Does not apply route daily. 1 each 1   latanoprost (XALATAN) 0.005 % ophthalmic solution INT 1 GTT IN OU HS  6   metFORMIN (GLUCOPHAGE) 500 MG  tablet TAKE 1 TABLET(500 MG) BY MOUTH TWICE DAILY WITH A MEAL 180 tablet 3   OneTouch Delica Lancets 33G MISC 1 Units by Does not apply route 4 (four) times daily -  before meals and at bedtime for 30 days. 120 each 11   ONETOUCH VERIO test strip TEST FOUR TIMES DAILY BEFORE MEALS AND AT BEDTIME 100 strip 11   rosuvastatin (CRESTOR) 20 MG tablet Take 1 tablet (20 mg total) by mouth daily. Please schedule office visit prior to additional refills. 90 tablet 0   No current facility-administered medications on file prior to visit.    No Known Allergies Social History   Occupational History   Not on file  Tobacco Use   Smoking status: Some Days   Smokeless tobacco: Never  Substance and Sexual Activity   Alcohol use: Yes    Alcohol/week: 1.0 standard drink of alcohol    Types: 1 Cans of beer per week    Comment: occas   Drug use: Never   Sexual activity: Not on file   Family History  Problem Relation Age of Onset   Breast cancer Neg Hx    Immunization History  Administered Date(s) Administered   Influenza,inj,Quad PF,6+ Mos 12/02/2017, 03/27/2021   PFIZER Comirnaty(Gray Top)Covid-19 Tri-Sucrose Vaccine 06/17/2019, 07/08/2019, 02/01/2020   PNEUMOCOCCAL CONJUGATE-20 03/27/2021   Pfizer Covid-19 Vaccine Bivalent Booster 56yrs & up 03/27/2021   Pneumococcal Polysaccharide-23 12/02/2017     Review of Systems: Negative except as noted in the HPI.   Objective: There were no vitals filed for this visit. Vascular Examination: Capillary refill time immediate b/l. Vascular status intact b/l  with palpable pedal pulses. Pedal hair absent b/l.  No pain with calf compression b/l. Skin temperature gradient WNL b/l. Trace edema noted BLE.  Neurological Examination: Sensation grossly intact b/l with 10 gram monofilament. Vibratory sensation intact b/l. Pt has subjective symptoms of neuropathy.  Dermatological Examination: Pedal skin with normal turgor, texture and tone b/l.  No open wounds. No  interdigital macerations.   Toenails 1-5 b/l well maintained with adequate length. No erythema, no edema, no drainage, no fluctuance. Porokeratotic lesion(s) bilateral great toes, bilateral 2nd toes, submet head 2 right foot, submet head 5 right foot, and 1st metatarsal head both feet. No erythema, no edema, no drainage, no fluctuance.  Musculoskeletal Examination: Muscle strength 5/5 to all lower extremity muscle groups bilaterally. HAV with bunion bilaterally and hammertoes 2-5 b/l. Patient ambulates independent of any assistive aids.  Radiographs: None  Lab Results  Component Value Date   HGBA1C 6.0 03/27/2021   ADA Risk Categorization: Low Risk :  Patient has all of the following: Intact protective sensation No prior foot ulcer  No severe deformity Pedal pulses present  Assessment: 1. Porokeratosis   2. Hallux valgus, acquired, bilateral   3. Acquired hammertoes of both feet   4. Diabetic peripheral neuropathy associated with type 2 diabetes mellitus (HCC)   5. Encounter for diabetic foot exam (HCC)     Plan: -Consent given for treatment as described below: -Examined patient. -Diabetic foot examination performed today. -Continue diabetic foot care principles: inspect feet daily, monitor glucose as recommended by PCP and/or Endocrinologist, and follow prescribed diet per PCP, Endocrinologist and/or dietician. -Patient to continue soft, supportive shoe gear daily. -Porokeratotic lesion(s) bilateral great toes, bilateral 2nd toes, submet head 2 right foot, submet head 5 left foot, and 1st metatarsal head both feet pared and enucleated with sterile currette without incident. Total number of lesions debrided=8. -Patient/POA to call should there be question/concern in the interim. Return in about 10 weeks (around 09/30/2022).  Freddie Breech, DPM

## 2022-08-01 ENCOUNTER — Other Ambulatory Visit: Payer: Self-pay | Admitting: Family Medicine

## 2022-08-05 ENCOUNTER — Other Ambulatory Visit: Payer: Self-pay | Admitting: Family Medicine

## 2022-08-05 LAB — HM DIABETES EYE EXAM

## 2022-08-06 NOTE — Telephone Encounter (Signed)
90 day supply not appropriate. Patient needs office visit

## 2022-08-31 ENCOUNTER — Other Ambulatory Visit: Payer: Self-pay | Admitting: Family Medicine

## 2022-09-01 ENCOUNTER — Other Ambulatory Visit: Payer: Self-pay | Admitting: Family Medicine

## 2022-09-30 ENCOUNTER — Ambulatory Visit (INDEPENDENT_AMBULATORY_CARE_PROVIDER_SITE_OTHER): Payer: 59 | Admitting: Podiatry

## 2022-09-30 ENCOUNTER — Encounter: Payer: Self-pay | Admitting: Podiatry

## 2022-09-30 DIAGNOSIS — Q828 Other specified congenital malformations of skin: Secondary | ICD-10-CM

## 2022-09-30 DIAGNOSIS — E1142 Type 2 diabetes mellitus with diabetic polyneuropathy: Secondary | ICD-10-CM | POA: Diagnosis not present

## 2022-09-30 NOTE — Progress Notes (Signed)
    Subjective:  Patient ID: Linda Livingston, female    DOB: [birth year],  MRN: [id number]  Linda Livingston presents to clinic today for:  Chief Complaint  Patient presents with   Callouses    Trim calluses bilateral    PCP is Ivery Quale, MD.  Past Medical History:  Diagnosis Date   Diabetes mellitus without complication (HCC)    No past surgical history on file.  No Known Allergies  Objective:  There were no vitals filed for this visit.  Linda Livingston is a pleasant 57 y.o. female in NAD. AAO x 3.  Vascular Examination: Capillary refill time is 3-5 seconds to toes bilateral. Palpable pedal pulses b/l LE. Digital hair present b/l. No pedal edema b/l. Skin temperature gradient WNL b/l. No varicosities b/l. No cyanosis or clubbing noted b/l.   Dermatological Examination: Pedal skin with normal turgor, texture and tone b/l. No open wounds. No interdigital macerations b/l.   Hyperkeratotic lesion present, with pain on palpation, located focally on the plantar right hallux x 2, distal right second toe, submet 2, distal left second toe, left hallux, left submet 1.  Assessment/Plan: 1. Porokeratosis   2. Diabetic peripheral neuropathy associated with type 2 diabetes mellitus (HCC)     The hyperkeratotic lesions were sharply debrided/shaved with sterile #313 blade.  Discussed with the patient what is causing the corns/calluses and reviewed treatment options today, including shaving the the painful lesion(s), off-loading techniques and pads, custom orthotics / shoe modifications.    Recommended urea 40 cream that she can purchase off [employer] for nightly use on her calluses to try to help keep them from building up so thick.  Informed her that this will only work on the dead skin buildup and will not cause burning or raw skin.  Return in about 3 months (around 12/31/2022) for prefers Dr. Eloy End if possible, for calluses.   Clerance Lav, DPM, FACFAS [facility]     [address] [place], [place] [address]                Office [phone]  Fax [phone]

## 2022-12-09 ENCOUNTER — Encounter: Payer: Self-pay | Admitting: Podiatry

## 2022-12-09 ENCOUNTER — Ambulatory Visit (INDEPENDENT_AMBULATORY_CARE_PROVIDER_SITE_OTHER): Payer: 59 | Admitting: Podiatry

## 2022-12-09 ENCOUNTER — Other Ambulatory Visit: Payer: Self-pay | Admitting: Family Medicine

## 2022-12-09 VITALS — BP 157/75 | HR 81

## 2022-12-09 DIAGNOSIS — Z1231 Encounter for screening mammogram for malignant neoplasm of breast: Secondary | ICD-10-CM

## 2022-12-09 DIAGNOSIS — Q828 Other specified congenital malformations of skin: Secondary | ICD-10-CM

## 2022-12-09 DIAGNOSIS — E1142 Type 2 diabetes mellitus with diabetic polyneuropathy: Secondary | ICD-10-CM | POA: Diagnosis not present

## 2022-12-10 NOTE — Progress Notes (Signed)
Subjective:  Patient ID: Linda Livingston, female    DOB: [birth year],  MRN: [id number]  Linda Livingston presents to clinic today for at risk foot care with history of diabetic neuropathy and painful porokeratotic lesions of both feet. Pain prevent(s) comfortable ambulation. Aggravating factor is weightbearing with and without shoegear.  Chief Complaint  Patient presents with   Callouses    BILAT BS - 117 A1C - DK  LVPCP - 12/2021   New problem(s): None.   PCP is Ivery Quale, MD.  No Known Allergies  Review of Systems: Negative except as noted in the HPI.  Objective: No changes noted in today's physical examination. Vitals:   12/09/22 1016 12/09/22 1027  BP: (!) 166/77 (!) 157/75  Pulse: 81   SpO2: 96%    Linda Livingston is a pleasant 57 y.o. female in NAD. AAO x 3.  Vascular Examination: Capillary refill time immediate b/l. Vascular status intact b/l with palpable pedal pulses. Pedal hair absent b/l.  No pain with calf compression b/l. Skin temperature gradient WNL b/l. Trace edema noted BLE.  Neurological Examination: Sensation grossly intact b/l with 10 gram monofilament. Vibratory sensation intact b/l. Pt has subjective symptoms of neuropathy.  Dermatological Examination: Pedal skin with normal turgor, texture and tone b/l.  No open wounds. No interdigital macerations.   Toenails 1-5 b/l well maintained with adequate length. No erythema, no edema, no drainage, no fluctuance.   Porokeratotic lesion(s) bilateral great toes, bilateral 2nd toes, submet head 2 right foot, submet head 5 right foot, and 1st metatarsal head both feet. No erythema, no edema, no drainage, no fluctuance.  Musculoskeletal Examination: Muscle strength 5/5 to all lower extremity muscle groups bilaterally. HAV with bunion bilaterally and hammertoes 2-5 b/l. Patient ambulates independent of any assistive aids.  Radiographs: None Assessment/Plan: 1. Porokeratosis   2. Diabetic peripheral  neuropathy associated with type 2 diabetes mellitus (HCC)     -Patient was evaluated and treated. All patient's and/or POA's questions/concerns answered on today's visit. -Continue foot and shoe inspections daily. Monitor blood glucose per PCP/Endocrinologist's recommendations. -Continue supportive shoe gear daily. -Porokeratotic lesion(s) bilateral great toes, bilateral 2nd toes, submet head 2 right foot, submet head 5 left foot, and 1st metatarsal head of both feet pared and enucleated with sterile currette without incident. Total number of lesions debrided=8. -Patient/POA to call should there be question/concern in the interim.   Return in about 3 months (around 03/10/2023).  Freddie Breech, DPM

## 2022-12-16 ENCOUNTER — Ambulatory Visit: Payer: 59 | Admitting: Family Medicine

## 2022-12-16 ENCOUNTER — Encounter: Payer: Self-pay | Admitting: Family Medicine

## 2022-12-16 ENCOUNTER — Other Ambulatory Visit: Payer: Self-pay | Admitting: Family Medicine

## 2022-12-16 VITALS — BP 126/70 | HR 90 | Wt 192.0 lb

## 2022-12-16 DIAGNOSIS — Z7984 Long term (current) use of oral hypoglycemic drugs: Secondary | ICD-10-CM

## 2022-12-16 DIAGNOSIS — Z23 Encounter for immunization: Secondary | ICD-10-CM

## 2022-12-16 DIAGNOSIS — E119 Type 2 diabetes mellitus without complications: Secondary | ICD-10-CM | POA: Diagnosis not present

## 2022-12-16 DIAGNOSIS — E785 Hyperlipidemia, unspecified: Secondary | ICD-10-CM | POA: Diagnosis not present

## 2022-12-16 LAB — POCT GLYCOSYLATED HEMOGLOBIN (HGB A1C): HbA1c, POC (prediabetic range): 6.5 % — AB (ref 5.7–6.4)

## 2022-12-16 MED ORDER — METFORMIN HCL 500 MG PO TABS: 500.0000 mg | ORAL_TABLET | Freq: Two times a day (BID) | ORAL | 2 refills | Status: DC

## 2022-12-16 MED ORDER — SHINGRIX 50 MCG/0.5ML IM SUSR: INTRAMUSCULAR | 1 refills | Status: DC

## 2022-12-16 MED ORDER — ROSUVASTATIN CALCIUM 20 MG PO TABS: 20.0000 mg | ORAL_TABLET | Freq: Every day | ORAL | 0 refills | Status: DC

## 2022-12-16 NOTE — Progress Notes (Addendum)
    SUBJECTIVE:   CHIEF COMPLAINT / HPI:   Diabetes Patient is here today for diabetes medication management. She currently takes metformin 500 twice a day, with most recent A1c of 6.0 in Jan 2023. She checks her blood sugar every morning before breakfast, and it is typically in the low 100s.  No recent instances of high blood sugars in the 200s to 300s.  No recent chest pain, SOB, HA, or vision changes.  No recent urinary changes.  She follows closely with podiatry.  HLD Patient currently takes rosuvastatin 20 mg daily.  No recent muscle pain or cramping noted.  PERTINENT  PMH / PSH: T2DM, HLD  OBJECTIVE:   BP 126/70   Pulse 90   Wt 192 lb (87.1 kg)   SpO2 98%   BMI 31.95 kg/m   General: Well-appearing. Resting comfortably in room. CV: Normal S1/S2. No extra heart sounds. Warm and well-perfused. Pulm: Breathing comfortably on room air. CTAB. No increased WOB. Abd: Soft, non-tender, non-distended. Skin:  Warm, dry. Psych: Pleasant and appropriate.    ASSESSMENT/PLAN:   Diabetes A1c in clinic stable at 6.5 today.  Diabetes appears well-controlled overall, follows closely with podiatry. -Continue metformin 500 BID -Ordered BMP, urine microalbumin, and lipid panel -Patient planning to see her ophthalmologist soon for annual diabetic eye exam  HLD Last lipid panel done in 2023. -Ordered lipid panel -Continue rosuvastatin 20 in the meantime.  Adjust dosage as needed following lipid panel results.  Health maintenance: -Patient to reach out to former provider about Pap smear results and schedule next routine Pap smear as needed. -Patient thinking over colonoscopy, unsure at this time -Received annual flu vaccine today -Given prescription for shingles vaccine to take to her preferred pharmacy at any time  Follow-up in 6 months for continued disease and medication management.  Ivery Quale, MD [facility]

## 2022-12-16 NOTE — Patient Instructions (Addendum)
Thank you for visiting clinic today - it is always our pleasure to care for you.  Today we discussed your diabetes and cholesterol, and collected a few labs to check in on your health.  Your A1c today was stable, so we will continue your current metformin regimen of 500 twice a day.  We will update your rosuvastatin as needed once your labs return.  In the meantime, refills for both of these medications have been sent to your pharmacy.  You received your flu vaccine today.  You may bring the prescription for the shingles vaccine to your preferred pharmacy at any time to get this done.  Please schedule an appointment with your eye doctor for an annual diabetic eye exam.  Please also contact your previous provider about your most recent Pap smear and schedule a follow-up appointment for your next routine Pap smear.  If you have any further questions or thoughts about colonoscopies, please let us know at any time.  Otherwise, please schedule a clinic appointment in 6 months for closer follow-up on your health and medications.  Reach out any time with any questions or concerns you may have - we are here for you!  Linda Quale, MD [facility] [phone]

## 2022-12-17 ENCOUNTER — Ambulatory Visit
Admission: RE | Admit: 2022-12-17 | Discharge: 2022-12-17 | Disposition: A | Discharge status: Home / Self Care | Payer: 59 | Source: Ambulatory Visit | Attending: Family Medicine | Admitting: Family Medicine

## 2022-12-17 DIAGNOSIS — Z1231 Encounter for screening mammogram for malignant neoplasm of breast: Secondary | ICD-10-CM

## 2022-12-17 LAB — LIPID PANEL
Chol/HDL Ratio: 2.7 {ratio} (ref 0.0–4.4)
Cholesterol, Total: 193 mg/dL (ref 100–199)
HDL: 72 mg/dL (ref >39)
LDL Chol Calc (NIH): 108 mg/dL — ABNORMAL HIGH (ref 0–99)
Triglycerides: 71 mg/dL (ref 0–149)
VLDL Cholesterol Cal: 13 mg/dL (ref 5–40)

## 2022-12-17 LAB — MICROALBUMIN / CREATININE URINE RATIO
Creatinine, Urine: 52.4 mg/dL
Microalb/Creat Ratio: <6 mg/g{creat} (ref 0–29)
Microalbumin, Urine: <3 ug/mL

## 2022-12-17 LAB — BASIC METABOLIC PANEL
BUN/Creatinine Ratio: 9 (ref 9–23)
BUN: 7 mg/dL (ref 6–24)
CO2: 24 mmol/L (ref 20–29)
Calcium: 9 mg/dL (ref 8.7–10.2)
Chloride: 102 mmol/L (ref 96–106)
Creatinine, Ser: 0.78 mg/dL (ref 0.57–1.00)
Glucose: 144 mg/dL — ABNORMAL HIGH (ref 70–99)
Potassium: 3.8 mmol/L (ref 3.5–5.2)
Sodium: 141 mmol/L (ref 134–144)
eGFR: 89 mL/min/{1.73_m2} (ref >59)

## 2022-12-26 ENCOUNTER — Other Ambulatory Visit: Payer: Self-pay | Admitting: Family Medicine

## 2022-12-26 MED ORDER — EZETIMIBE 10 MG PO TABS: 10.0000 mg | ORAL_TABLET | Freq: Every day | ORAL | 3 refills | Status: DC

## 2022-12-26 NOTE — Progress Notes (Signed)
Called and spoke with patient to discuss recent lipid panel results. Given LDL of 108 greater than goal 70, will add Zetia 10 daily in addition to her Crestor 20 daily for improved cholesterol control. Zetia prescription sent to patient's preferred pharmacy. Discussed returning to clinic if patient develops side effects from the new medication.

## 2022-12-30 ENCOUNTER — Ambulatory Visit: Payer: 59 | Admitting: *Deleted

## 2022-12-30 DIAGNOSIS — Z0000 Encounter for general adult medical examination without abnormal findings: Secondary | ICD-10-CM

## 2022-12-30 NOTE — Progress Notes (Signed)
Subjective:   Linda Livingston is a 57 y.o. female who presents for an Initial Medicare Annual Wellness Visit.  Visit Complete: Virtual I connected with  Linda Livingston on 12/30/22 by a audio enabled telemedicine application and verified that I am speaking with the correct person using two identifiers.  Patient Location: Home  Provider Location: Home Office  I discussed the limitations of evaluation and management by telemedicine. The patient expressed understanding and agreed to proceed.  Vital Signs: Because this visit was a virtual/telehealth visit, some criteria may be missing or patient reported. Any vitals not documented were not able to be obtained and vitals that have been documented are patient reported.   Cardiac Risk Factors include: advanced age (>65men, >34 women);diabetes mellitus;smoking/ tobacco exposure     Objective:    There were no vitals filed for this visit. There is no height or weight on file to calculate BMI.     12/30/2022   11:08 AM 12/16/2022    9:22 AM 03/27/2021    9:00 AM 08/01/2019    3:30 PM 03/05/2018    1:35 PM 12/02/2017   10:06 AM 10/28/2017    2:51 PM  Advanced Directives  Does Patient Have a Medical Advance Directive? No No No No No No No  Would patient like information on creating a medical advance directive?  No - Patient declined No - Patient declined No - Patient declined No - Patient declined No - Patient declined Yes (MAU/Ambulatory/Procedural Areas - Information given)    Current Medications (verified) Outpatient Encounter Medications as of 12/30/2022  Medication Sig   Blood Glucose Monitoring Suppl (ONETOUCH VERIO) w/Device KIT 1 Units by Does not apply route daily.   ezetimibe (ZETIA) 10 MG tablet Take 1 tablet (10 mg total) by mouth daily.   FLUCELVAX QUADRIVALENT 0.5 ML injection    Lancet Devices (ONE TOUCH DELICA LANCING DEV) MISC 1 Units by Does not apply route daily.   latanoprost (XALATAN) 0.005 % ophthalmic solution  INT 1 GTT IN OU HS   metFORMIN (GLUCOPHAGE) 500 MG tablet Take 1 tablet (500 mg total) by mouth 2 (two) times daily with a meal. Please schedule office visit prior to any additional refills.   ONETOUCH VERIO test strip TEST FOUR TIMES DAILY BEFORE MEALS AND AT BEDTIME   rosuvastatin (CRESTOR) 20 MG tablet Take 1 tablet (20 mg total) by mouth daily.   OneTouch Delica Lancets 33G MISC 1 Units by Does not apply route 4 (four) times daily -  before meals and at bedtime for 30 days.   Zoster Vaccine Adjuvanted [place]) injection Administer Shingrix vaccination now and repeat in two months (Patient not taking: Reported on 12/30/2022)   No facility-administered encounter medications on file as of 12/30/2022.    Allergies (verified) Patient has no known allergies.   History: Past Medical History:  Diagnosis Date   Diabetes mellitus without complication (HCC)    History reviewed. No pertinent surgical history. Family History  Problem Relation Age of Onset   Breast cancer Neg Hx    Social History   Socioeconomic History   Marital status: Married    Spouse name: Not on file   Number of children: Not on file   Years of education: Not on file   Highest education level: Not on file  Occupational History   Not on file  Tobacco Use   Smoking status: Some Days    Passive exposure: Current   Smokeless tobacco: Never  Substance and Sexual Activity  Alcohol use: Yes    Alcohol/week: 1.0 standard drink of alcohol    Types: 1 Cans of beer per week    Comment: occas   Drug use: Never   Sexual activity: Not Currently  Other Topics Concern   Not on file  Social History Narrative   Not on file   Social Determinants of Health   Financial Resource Strain: Low Risk  (12/30/2022)   Overall Financial Resource Strain (CARDIA)    Difficulty of Paying Living Expenses: Not hard at all  Food Insecurity: No Food Insecurity (12/30/2022)   Hunger Vital Sign    Worried About Running Out of Food in  the Last Year: Never true    Ran Out of Food in the Last Year: Never true  Transportation Needs: No Transportation Needs (12/30/2022)   PRAPARE - Administrator, Civil Service (Medical): No    Lack of Transportation (Non-Medical): No  Physical Activity: Insufficiently Active (12/30/2022)   Exercise Vital Sign    Days of Exercise per Week: 2 days    Minutes of Exercise per Session: 30 min  Stress: No Stress Concern Present (12/30/2022)   [employer] of Occupational Health - Occupational Stress Questionnaire    Feeling of Stress : Not at all  Social Connections: Moderately Integrated (12/30/2022)   Social Connection and Isolation Panel [NHANES]    Frequency of Communication with Friends and Family: More than three times a week    Frequency of Social Gatherings with Friends and Family: Twice a week    Attends Religious Services: More than 4 times per year    Active Member of [employer] or Organizations: No    Attends Engineer, structural: Never    Marital Status: Married    Tobacco Counseling Ready to quit: Not Answered Counseling given: Not Answered   Clinical Intake:  Pre-visit preparation completed: Yes  Pain : No/denies pain     Diabetes: Yes CBG done?: No Did pt. bring in CBG monitor from home?: No  How often do you need to have someone help you when you read instructions, pamphlets, or other written materials from your doctor or pharmacy?: 1 - Never  Interpreter Needed?: No  Information entered by :: Remi Haggard LPN   Activities of Daily Living    12/30/2022   11:11 AM  In your present state of health, do you have any difficulty performing the following activities:  Hearing? 0  Vision? 0  Difficulty concentrating or making decisions? 0  Walking or climbing stairs? 0  Dressing or bathing? 0  Doing errands, shopping? 0  Preparing Food and eating ? N  Using the Toilet? N  In the past six months, have you accidently leaked urine? N  Do  you have problems with loss of bowel control? N  Managing your Medications? N  Managing your Finances? N  Housekeeping or managing your Housekeeping? N    Patient Care Team: Ivery Quale, MD as PCP - General  Indicate any recent Medical Services you may have received from other than [facility] providers in the past year (date may be approximate).     Assessment:   This is a routine wellness examination for Linda Livingston.  Hearing/Vision screen Hearing Screening - Comments:: No trouble hearing Vision Screening - Comments:: [facility] Not up to date   Goals Addressed             This Visit's Progress    Weight (lb) < 200 lb (90.7 kg)  Depression Screen    12/30/2022   11:13 AM 12/16/2022    9:27 AM 03/27/2021    9:00 AM 08/01/2019    3:30 PM 03/05/2018    1:34 PM 12/02/2017   10:06 AM 11/10/2017    5:25 PM  PHQ 2/9 Scores  PHQ - 2 Score 0 3 3 0 0 0 0  PHQ- 9 Score 0 3 3        Fall Risk    12/30/2022   11:07 AM 08/01/2019    3:30 PM 03/05/2018    1:34 PM 12/02/2017   10:06 AM 11/10/2017    5:25 PM  Fall Risk   Falls in the past year? 0 0 0 No No  Number falls in past yr: 0 0     Injury with Fall? 0 0     Follow up Falls evaluation completed;Education provided;Falls prevention discussed        MEDICARE RISK AT HOME: Medicare Risk at Home Any stairs in or around the home?: No If so, are there any without handrails?: No Home free of loose throw rugs in walkways, pet beds, electrical cords, etc?: Yes Adequate lighting in your home to reduce risk of falls?: Yes Life alert?: No Use of a cane, walker or w/c?: No Grab bars in the bathroom?: No Shower chair or bench in shower?: No Elevated toilet seat or a handicapped toilet?: No  TIMED UP AND GO:  Was the test performed? No    Cognitive Function:        12/30/2022   11:10 AM  6CIT Screen  What Year? 0 points  What month? 0 points  What time? 0 points  Count back from 20 0 points  Months in reverse 0 points   Repeat phrase 0 points  Total Score 0 points    Immunizations Immunization History  Administered Date(s) Administered   Influenza, Seasonal, Injecte, Preservative Fre 12/16/2022   Influenza,inj,Quad PF,6+ Mos 12/02/2017, 03/27/2021   PFIZER Comirnaty(Gray Top)Covid-19 Tri-Sucrose Vaccine 06/17/2019, 07/08/2019, 02/01/2020   PNEUMOCOCCAL CONJUGATE-20 03/27/2021   Pfizer Covid-19 Vaccine Bivalent Booster 38yrs & up 03/27/2021   Pneumococcal Polysaccharide-23 12/02/2017    TDAP status: Due, Education has been provided regarding the importance of this vaccine. Advised may receive this vaccine at local pharmacy or Health Dept. Aware to provide a copy of the vaccination record if obtained from local pharmacy or Health Dept. Verbalized acceptance and understanding.  Flu Vaccine status: Due, Education has been provided regarding the importance of this vaccine. Advised may receive this vaccine at local pharmacy or Health Dept. Aware to provide a copy of the vaccination record if obtained from local pharmacy or Health Dept. Verbalized acceptance and understanding.    Covid-19 vaccine status: Information provided on how to obtain vaccines.   Qualifies for Shingles Vaccine? Yes   Zostavax completed No   Shingrix Completed?: No.    Education has been provided regarding the importance of this vaccine. Patient has been advised to call insurance company to determine out of pocket expense if they have not yet received this vaccine. Advised may also receive vaccine at local pharmacy or Health Dept. Verbalized acceptance and understanding.  Screening Tests Health Maintenance  Topic Date Due   Hepatitis C Screening  Never done   DTaP/Tdap/Td (1 - Tdap) Never done   Cervical Cancer Screening (HPV/Pap Cotest)  Never done   Colonoscopy  Never done   Zoster Vaccines- Shingrix (1 of 2) Never done   COVID-19 Vaccine (5 - 2023-24 season)  11/16/2022   HEMOGLOBIN A1C  06/16/2023   FOOT EXAM  07/22/2023    OPHTHALMOLOGY EXAM  08/05/2023   Diabetic kidney evaluation - eGFR measurement  12/16/2023   Diabetic kidney evaluation - Urine ACR  12/16/2023   Medicare Annual Wellness (AWV)  12/30/2023   MAMMOGRAM  12/16/2024   INFLUENZA VACCINE  Completed   HIV Screening  Completed   HPV VACCINES  Aged Out    Health Maintenance  Health Maintenance Due  Topic Date Due   Hepatitis C Screening  Never done   DTaP/Tdap/Td (1 - Tdap) Never done   Cervical Cancer Screening (HPV/Pap Cotest)  Never done   Colonoscopy  Never done   Zoster Vaccines- Shingrix (1 of 2) Never done   COVID-19 Vaccine (5 - 2023-24 season) 11/16/2022    Colonoscopy .    Patient will call MD when ready  Mammogram status: Completed  . Repeat every year    Lung Cancer Screening: (Low Dose CT Chest recommended if Age 62-80 years, 20 pack-year currently smoking OR have quit w/in 15years.) does not qualify.   Lung Cancer Screening Referral:   Additional Screening:  Hepatitis C Screening:    Never Done  Vision Screening: Recommended annual ophthalmology exams for early detection of glaucoma and other disorders of the eye. Is the patient up to date with their annual eye exam?  No  Who is the provider or what is the name of the office in which the patient attends annual eye exams? [facility] If pt is not established with a provider, would they like to be referred to a provider to establish care? No .   Dental Screening: Recommended annual dental exams for proper oral hygiene  Nutrition Risk Assessment:  Has the patient had any N/V/D within the last 2 months?  No  Does the patient have any non-healing wounds?  No  Has the patient had any unintentional weight loss or weight gain?  No   Diabetes:  Is the patient diabetic?  Yes  If diabetic, was a CBG obtained today?  No  Did the patient bring in their glucometer from home?  No  How often do you monitor your CBG's? 2 x a day.   Financial Strains and Diabetes  Management:  Are you having any financial strains with the device, your supplies or your medication? No .  Does the patient want to be seen by Chronic Care Management for management of their diabetes?  No  Would the patient like to be referred to a Nutritionist or for Diabetic Management?  No   Diabetic Exams:  Diabetic Eye Exam: . Overdue for diabetic eye exam. Pt has been advised about the importance in completing this exam  Diabetic Foot Exam: . Pt has been advised about the importance in completing this exam.   Community Resource Referral / Chronic Care Management: CRR required this visit?  No   CCM required this visit?  No     Plan:     I have personally reviewed and noted the following in the patient's chart:   Medical and social history Use of alcohol, tobacco or illicit drugs  Current medications and supplements including opioid prescriptions. Patient is not currently taking opioid prescriptions. Functional ability and status Nutritional status Physical activity Advanced directives List of other physicians Hospitalizations, surgeries, and ER visits in previous 12 months Vitals Screenings to include cognitive, depression, and falls Referrals and appointments  In addition, I have reviewed and discussed with patient certain preventive protocols, quality  metrics, and best practice recommendations. A written personalized care plan for preventive services as well as general preventive health recommendations were provided to patient.     Remi Haggard, LPN   [id number]   After Visit Summary: (MyChart) Due to this being a telephonic visit, the after visit summary with patients personalized plan was offered to patient via MyChart   Nurse Notes:

## 2022-12-30 NOTE — Patient Instructions (Signed)
Linda Livingston , Thank you for taking time to come for your Medicare Wellness Visit. I appreciate your ongoing commitment to your health goals. Please review the following plan we discussed and let me know if I can assist you in the future.   Screening recommendations/referrals: Colonoscopy: Education provided Mammogram: up to date Recommended yearly ophthalmology/optometry visit for glaucoma screening and checkup Recommended yearly dental visit for hygiene and checkup  Vaccinations: Influenza vaccine: Education provided Tdap vaccine: Education provided Shingles vaccine: Education provided    Advanced directives: Education provided   Preventive Care 40-64 Years and Older, Female Preventive care refers to lifestyle choices and visits with your health care provider that can promote health and wellness. What does preventive care include? A yearly physical exam. This is also called an annual well check. Dental exams once or twice a year. Routine eye exams. Ask your health care provider how often you should have your eyes checked. Personal lifestyle choices, including: Daily care of your teeth and gums. Regular physical activity. Eating a healthy diet. Avoiding tobacco and drug use. Limiting alcohol use. Practicing safe sex. Taking low-dose aspirin every day. Taking vitamin and mineral supplements as recommended by your health care provider. What happens during an annual well check? The services and screenings done by your health care provider during your annual well check will depend on your age, overall health, lifestyle risk factors, and family history of disease. Counseling  Your health care provider may ask you questions about your: Alcohol use. Tobacco use. Drug use. Emotional well-being. Home and relationship well-being. Sexual activity. Eating habits. History of falls. Memory and ability to understand (cognition). Work and work Astronomer. Reproductive  health. Screening  You may have the following tests or measurements: Height, weight, and BMI. Blood pressure. Lipid and cholesterol levels. These may be checked every 5 years, or more frequently if you are over 25 years old. Skin check. Lung cancer screening. You may have this screening every year starting at age 67 if you have a 30-pack-year history of smoking and currently smoke or have quit within the past 15 years. Fecal occult blood test (FOBT) of the stool. You may have this test every year starting at age 77. Flexible sigmoidoscopy or colonoscopy. You may have a sigmoidoscopy every 5 years or a colonoscopy every 10 years starting at age 37. Hepatitis C blood test. Hepatitis B blood test. Sexually transmitted disease (STD) testing. Diabetes screening. This is done by checking your blood sugar (glucose) after you have not eaten for a while (fasting). You may have this done every 1-3 years. Bone density scan. This is done to screen for osteoporosis. You may have this done starting at age 16. Mammogram. This may be done every 1-2 years. Talk to your health care provider about how often you should have regular mammograms. Talk with your health care provider about your test results, treatment options, and if necessary, the need for more tests. Vaccines  Your health care provider may recommend certain vaccines, such as: Influenza vaccine. This is recommended every year. Tetanus, diphtheria, and acellular pertussis (Tdap, Td) vaccine. You may need a Td booster every 10 years. Zoster vaccine. You may need this after age 68. Pneumococcal 13-valent conjugate (PCV13) vaccine. One dose is recommended after age 50. Pneumococcal polysaccharide (PPSV23) vaccine. One dose is recommended after age 100. Talk to your health care provider about which screenings and vaccines you need and how often you need them. This information is not intended to replace advice given to you  by your health care provider.  Make sure you discuss any questions you have with your health care provider. Document Released: 03/30/2015 Document Revised: 11/21/2015 Document Reviewed: 01/02/2015 Elsevier Interactive Patient Education  2017 [employer].  Fall Prevention in the Home Falls can cause injuries. They can happen to people of all ages. There are many things you can do to make your home safe and to help prevent falls. What can I do on the outside of my home? Regularly fix the edges of walkways and driveways and fix any cracks. Remove anything that might make you trip as you walk through a door, such as a raised step or threshold. Trim any bushes or trees on the path to your home. Use bright outdoor lighting. Clear any walking paths of anything that might make someone trip, such as rocks or tools. Regularly check to see if handrails are loose or broken. Make sure that both sides of any steps have handrails. Any raised decks and porches should have guardrails on the edges. Have any leaves, snow, or ice cleared regularly. Use sand or salt on walking paths during winter. Clean up any spills in your garage right away. This includes oil or grease spills. What can I do in the bathroom? Use night lights. Install grab bars by the toilet and in the tub and shower. Do not use towel bars as grab bars. Use non-skid mats or decals in the tub or shower. If you need to sit down in the shower, use a plastic, non-slip stool. Keep the floor dry. Clean up any water that spills on the floor as soon as it happens. Remove soap buildup in the tub or shower regularly. Attach bath mats securely with double-sided non-slip rug tape. Do not have throw rugs and other things on the floor that can make you trip. What can I do in the bedroom? Use night lights. Make sure that you have a light by your bed that is easy to reach. Do not use any sheets or blankets that are too big for your bed. They should not hang down onto the floor. Have a  firm chair that has side arms. You can use this for support while you get dressed. Do not have throw rugs and other things on the floor that can make you trip. What can I do in the kitchen? Clean up any spills right away. Avoid walking on wet floors. Keep items that you use a lot in easy-to-reach places. If you need to reach something above you, use a strong step stool that has a grab bar. Keep electrical cords out of the way. Do not use floor polish or wax that makes floors slippery. If you must use wax, use non-skid floor wax. Do not have throw rugs and other things on the floor that can make you trip. What can I do with my stairs? Do not leave any items on the stairs. Make sure that there are handrails on both sides of the stairs and use them. Fix handrails that are broken or loose. Make sure that handrails are as long as the stairways. Check any carpeting to make sure that it is firmly attached to the stairs. Fix any carpet that is loose or worn. Avoid having throw rugs at the top or bottom of the stairs. If you do have throw rugs, attach them to the floor with carpet tape. Make sure that you have a light switch at the top of the stairs and the bottom of the stairs. If  you do not have them, ask someone to add them for you. What else can I do to help prevent falls? Wear shoes that: Do not have high heels. Have rubber bottoms. Are comfortable and fit you well. Are closed at the toe. Do not wear sandals. If you use a stepladder: Make sure that it is fully opened. Do not climb a closed stepladder. Make sure that both sides of the stepladder are locked into place. Ask someone to hold it for you, if possible. Clearly mark and make sure that you can see: Any grab bars or handrails. First and last steps. Where the edge of each step is. Use tools that help you move around (mobility aids) if they are needed. These include: Canes. Walkers. Scooters. Crutches. Turn on the lights when you  go into a dark area. Replace any light bulbs as soon as they burn out. Set up your furniture so you have a clear path. Avoid moving your furniture around. If any of your floors are uneven, fix them. If there are any pets around you, be aware of where they are. Review your medicines with your doctor. Some medicines can make you feel dizzy. This can increase your chance of falling. Ask your doctor what other things that you can do to help prevent falls. This information is not intended to replace advice given to you by your health care provider. Make sure you discuss any questions you have with your health care provider. Document Released: 12/28/2008 Document Revised: 08/09/2015 Document Reviewed: 04/07/2014 Elsevier Interactive Patient Education  2017 [employer].

## 2022-12-31 ENCOUNTER — Ambulatory Visit: Payer: 59 | Admitting: Podiatry

## 2023-01-06 ENCOUNTER — Telehealth: Payer: Self-pay | Admitting: Family Medicine

## 2023-01-06 NOTE — Telephone Encounter (Signed)
Spoke with patient about recent normal mammogram results and routine screening again in 1 year. Patient expressed understanding and did not have questions at the time.

## 2023-01-16 ENCOUNTER — Other Ambulatory Visit: Payer: Self-pay | Admitting: Family Medicine

## 2023-03-24 ENCOUNTER — Encounter: Payer: Self-pay | Admitting: Podiatry

## 2023-03-24 ENCOUNTER — Ambulatory Visit (INDEPENDENT_AMBULATORY_CARE_PROVIDER_SITE_OTHER): Payer: 59 | Admitting: Podiatry

## 2023-03-24 DIAGNOSIS — Q828 Other specified congenital malformations of skin: Secondary | ICD-10-CM

## 2023-03-24 DIAGNOSIS — E1142 Type 2 diabetes mellitus with diabetic polyneuropathy: Secondary | ICD-10-CM

## 2023-03-30 ENCOUNTER — Encounter: Payer: Self-pay | Admitting: Podiatry

## 2023-03-30 NOTE — Progress Notes (Signed)
  Subjective:  Patient ID: Linda Livingston, female    DOB: 1965-04-26,  MRN: [id number]  57 y.o. female presents at risk foot care with history of diabetic neuropathy and painful porokeratotic lesions b/l feet. Pain prevent(s) comfortable ambulation. Aggravating factor is weightbearing with and without shoegear. Chief Complaint  Patient presents with   Diabetes    [facility]   New problem(s): None   PCP is Diona Perkins, MD , and last visit was December 16, 2022.  No Known Allergies  Review of Systems: Negative except as noted in the HPI.   Objective:  Linda Livingston is a pleasant 58 y.o. female in NAD. AAO x 3.  Vascular Examination: Vascular status intact b/l with palpable pedal pulses. CFT immediate b/l. Pedal hair present. No edema. No pain with calf compression b/l. Skin temperature gradient WNL b/l. No varicosities noted. No cyanosis or clubbing noted.  Neurological Examination: Pt has subjective symptoms of neuropathy. Sensation grossly intact b/l with 10 gram monofilament. Vibratory sensation intact b/l.  Dermatological Examination: Pedal skin with normal turgor, texture and tone b/l. No open wounds nor interdigital macerations noted. Toenails 1-5 b/l thick, discolored, elongated with subungual debris and pain on dorsal palpation.   Porokeratotic lesion(s) bilateral great toes, bilateral 2nd toes, submet head 2 right foot, submet head 5 right foot, and 1st metatarsal head both feet. No erythema, no edema, no drainage, no fluctuance.  Musculoskeletal Examination: Muscle strength 5/5 to all lower extremity muscle groups bilaterally. HAV with bunion bilaterally and hammertoes 2-5 b/l. Patient ambulates independent of any assistive aids.  Radiographs: None  Last A1c:      Latest Ref Rng & Units 12/16/2022    9:44 AM  Hemoglobin A1C  Hemoglobin-A1c 5.7 - 6.4 % 6.5      Assessment:   1. Porokeratosis   2. Diabetic peripheral neuropathy associated with type 2 diabetes mellitus  (HCC)    Plan:  -Consent given for treatment as described below: -Examined patient. -Patient to continue soft, supportive shoe gear daily. -Porokeratotic lesion(s) left great toe, L 2nd toe, right great toe, R 2nd toe, submet head 2 right foot, submet head 5 left foot, and 1st metatarsal head left foot and right foot pared and enucleated with sterile currette without incident. Total number of lesions debrided=8. -Patient/POA to call should there be question/concern in the interim.  Return in about 3 months (around 06/22/2023).  Delon LITTIE Merlin, DPM      [place] LOCATION: [address], [place] [address]                   Office [phone]   [facility] LOCATION: [address] [place], [place] [address] Office [phone]

## 2023-04-27 ENCOUNTER — Other Ambulatory Visit: Payer: Self-pay | Admitting: Family Medicine

## 2023-04-27 NOTE — Telephone Encounter (Signed)
 Chart reviewed. Rx refilled.

## 2023-05-11 ENCOUNTER — Other Ambulatory Visit: Payer: Self-pay | Admitting: Family Medicine

## 2023-05-11 NOTE — Telephone Encounter (Signed)
 Chart reviewed. Rx refilled.

## 2023-06-03 ENCOUNTER — Other Ambulatory Visit: Payer: Self-pay | Admitting: Family Medicine

## 2023-06-03 NOTE — Telephone Encounter (Signed)
 Chart reviewed. Rx refilled. Requesting patient to be seen in clinic in next 1-2 months.

## 2023-06-23 ENCOUNTER — Encounter: Payer: Self-pay | Admitting: Podiatry

## 2023-06-23 ENCOUNTER — Ambulatory Visit (INDEPENDENT_AMBULATORY_CARE_PROVIDER_SITE_OTHER): Payer: 59 | Admitting: Podiatry

## 2023-06-23 VITALS — Ht 65.0 in | Wt 192.0 lb

## 2023-06-23 DIAGNOSIS — E1142 Type 2 diabetes mellitus with diabetic polyneuropathy: Secondary | ICD-10-CM

## 2023-06-23 DIAGNOSIS — Q828 Other specified congenital malformations of skin: Secondary | ICD-10-CM | POA: Diagnosis not present

## 2023-06-28 NOTE — Progress Notes (Signed)
  Subjective:  Patient ID: Linda Livingston, female    DOB: 1965/11/18,  MRN: [id number]  Linda Livingston presents to clinic today for at risk foot care with history of diabetic neuropathy and painful porokeratotic lesions left foot and right foot. Pain prevent(s) comfortable ambulation. Aggravating factor is weightbearing with and without shoegear.  Chief Complaint  Patient presents with   Callouses    I am here for a callous trim. PCP is dr Fernand Howard and see her the end of this month   New problem(s): None.   PCP is Carey Chapman, MD. [name] 12/16/2022.  No Known Allergies  Review of Systems: Negative except as noted in the HPI.  Objective: No changes noted in today's physical examination. There were no vitals filed for this visit. Linda Livingston is a pleasant 58 y.o. female in NAD. AAO x 3.  Vascular Examination: Vascular status intact b/l with palpable pedal pulses. CFT immediate b/l. Pedal hair present. No edema. No pain with calf compression b/l. Skin temperature gradient WNL b/l. No varicosities noted. No cyanosis or clubbing noted.  Neurological Examination: Pt has subjective symptoms of neuropathy. Sensation grossly intact b/l with 10 gram monofilament. Vibratory sensation intact b/l.  Dermatological Examination: Pedal skin with normal turgor, texture and tone b/l. No open wounds nor interdigital macerations noted. Toenails recently debrided.  Porokeratotic lesion(s) bilateral great toes, bilateral 2nd toes, submet head 2 right foot, submet head 5 right foot, and 1st metatarsal head both feet. No erythema, no edema, no drainage, no fluctuance.  Musculoskeletal Examination: Muscle strength 5/5 to all lower extremity muscle groups bilaterally. HAV with bunion bilaterally and hammertoes 2-5 b/l. Patient ambulates independent of any assistive aids.  Radiographs: None  Assessment/Plan: 1. Porokeratosis   2. Diabetic peripheral neuropathy associated with type 2 diabetes mellitus  (HCC)   Patient was evaluated and treated. All patient's and/or POA's questions/concerns addressed on today's visit. Porokeratotic lesion(s) bilateral great toes, bilateral 2nd toes, submet head 2 right foot, submet head 5 left foot, and 1st metatarsal head of both feet pared with sharp debridement without incident. Continue daily foot inspections and monitor blood glucose per PCP/Endocrinologist's recommendations. Continue soft, supportive shoe gear daily. Report any pedal injuries to medical professional. Call office if there are any questions/concerns. -Patient/POA to call should there be question/concern in the interim.   Return in about 9 weeks (around 08/25/2023).  Linda Livingston, DPM      [place] LOCATION: [address] [place] [address]                   Office [phone]   [facility] LOCATION: [address] [place], [place] [address] Office [phone]

## 2023-07-09 ENCOUNTER — Ambulatory Visit: Admitting: Family Medicine

## 2023-07-09 NOTE — Progress Notes (Deleted)
    SUBJECTIVE:   CHIEF COMPLAINT / HPI:   ***  PERTINENT  PMH / PSH: ***  OBJECTIVE:   There were no vitals taken for this visit.  ***  ASSESSMENT/PLAN:   Assessment & Plan      Carey Chapman, MD [facility]

## 2023-08-17 ENCOUNTER — Other Ambulatory Visit: Payer: Self-pay | Admitting: Family Medicine

## 2023-08-18 NOTE — Telephone Encounter (Signed)
 Chart reviewed. Rx refilled.

## 2023-09-22 ENCOUNTER — Ambulatory Visit: Payer: 59 | Admitting: Podiatry

## 2023-09-22 ENCOUNTER — Encounter: Payer: Self-pay | Admitting: Podiatry

## 2023-09-22 DIAGNOSIS — M2042 Other hammer toe(s) (acquired), left foot: Secondary | ICD-10-CM | POA: Diagnosis not present

## 2023-09-22 DIAGNOSIS — E119 Type 2 diabetes mellitus without complications: Secondary | ICD-10-CM | POA: Diagnosis not present

## 2023-09-22 DIAGNOSIS — E1142 Type 2 diabetes mellitus with diabetic polyneuropathy: Secondary | ICD-10-CM | POA: Diagnosis not present

## 2023-09-22 DIAGNOSIS — M2012 Hallux valgus (acquired), left foot: Secondary | ICD-10-CM

## 2023-09-22 DIAGNOSIS — Q828 Other specified congenital malformations of skin: Secondary | ICD-10-CM | POA: Diagnosis not present

## 2023-09-22 DIAGNOSIS — M2011 Hallux valgus (acquired), right foot: Secondary | ICD-10-CM | POA: Diagnosis not present

## 2023-09-22 DIAGNOSIS — M2041 Other hammer toe(s) (acquired), right foot: Secondary | ICD-10-CM

## 2023-09-22 NOTE — Progress Notes (Signed)
  Subjective:  Patient ID: Linda Livingston, female    DOB: 09-Jan-1966,  MRN: [id number]  Linda Livingston presents to clinic today for for annual diabetic foot examination  Chief Complaint  Patient presents with   Diabetes    [facility] NIDDM A1C 6.5. Toenail trim and callus care bilateral.   New problem(s): None.   PCP is Diona Perkins, MD. Linda Livingston 12/16/2022.  No Known Allergies  Review of Systems: Negative except as noted in the HPI.  Objective: No changes noted in today's physical examination. There were no vitals filed for this visit. Linda Livingston is a pleasant 58 y.o. female in NAD. AAO x 3.  Vascular Examination: Capillary refill time immediate b/l. Palpable pedal pulses. Pedal hair present b/l. No pain with calf compression b/l. Skin temperature gradient WNL b/l. No cyanosis or clubbing b/l. No ischemia or gangrene noted b/l.   Neurological Examination: Sensation grossly intact b/l with 10 gram monofilament. Vibratory sensation intact b/l. Pt has subjective symptoms of neuropathy.  Dermatological Examination: Pedal skin with normal turgor, texture and tone b/l.  No open wounds. No interdigital macerations.   Toenails 1-5 b/l well maintained with adequate length. No erythema, no edema, no drainage, no fluctuance. Porokeratotic lesion(s) medial IPJ of right great toe, plantar IPJ of left great toe, submet head 2 right foot, submet head 5 right foot, and 1st metatarsal head of both feet. No erythema, no edema, no drainage, no fluctuance.  Musculoskeletal Examination: Muscle strength 5/5 to all lower extremity muscle groups bilaterally. Patient ambulates independent of any assistive aids.. No pain, crepitus or joint limitation noted with ROM b/l LE.  Patient ambulates independently without assistive aids.  Radiographs: None  Last A1c:      Latest Ref Rng & Units 12/16/2022    9:44 AM  Hemoglobin A1C  Hemoglobin-A1c 5.7 - 6.4 % 6.5    Assessment/Plan: 1. Porokeratosis   2.  Diabetic peripheral neuropathy associated with type 2 diabetes mellitus (HCC)   3. Hallux valgus, acquired, bilateral   4. Acquired hammertoes of both feet   5. Encounter for diabetic foot exam (HCC)     -Diabetic foot examination performed today. -Patient to continue soft, supportive shoe gear daily. -Porokeratotic lesion(s) medial IPJ of left great toe, medial IPJ of right great toe, submet head 2 right foot, submet head 5 left foot, and 1st metatarsal head of both feet pared and enucleated with sterile currette without incident. Total number of lesions debrided=6. -Patient/POA to call should there be question/concern in the interim.   No follow-ups on file.  Linda Livingston, DPM      [place] LOCATION: [address] [place] [address]                   Office [phone]   [facility] LOCATION: [address] [place], [place] [address] Office [phone]

## 2023-09-27 ENCOUNTER — Encounter: Payer: Self-pay | Admitting: Podiatry

## 2023-10-20 ENCOUNTER — Other Ambulatory Visit: Payer: Self-pay | Admitting: Family Medicine

## 2023-10-20 NOTE — Telephone Encounter (Signed)
 Chart reviewed. Rx refilled.

## 2023-11-24 ENCOUNTER — Emergency Department (HOSPITAL_COMMUNITY)

## 2023-11-24 ENCOUNTER — Encounter (HOSPITAL_COMMUNITY): Payer: Self-pay

## 2023-11-24 ENCOUNTER — Observation Stay (HOSPITAL_COMMUNITY)
Admission: EM | Admit: 2023-11-24 | Discharge: 2023-11-26 | Disposition: A | Discharge status: Home / Self Care | Attending: Emergency Medicine | Admitting: Emergency Medicine

## 2023-11-24 ENCOUNTER — Other Ambulatory Visit: Payer: Self-pay

## 2023-11-24 DIAGNOSIS — R079 Chest pain, unspecified: Secondary | ICD-10-CM | POA: Diagnosis present

## 2023-11-24 DIAGNOSIS — F17210 Nicotine dependence, cigarettes, uncomplicated: Secondary | ICD-10-CM | POA: Diagnosis not present

## 2023-11-24 DIAGNOSIS — R000 Tachycardia, unspecified: Principal | ICD-10-CM | POA: Insufficient documentation

## 2023-11-24 DIAGNOSIS — R109 Unspecified abdominal pain: Secondary | ICD-10-CM | POA: Insufficient documentation

## 2023-11-24 DIAGNOSIS — E1165 Type 2 diabetes mellitus with hyperglycemia: Secondary | ICD-10-CM | POA: Diagnosis not present

## 2023-11-24 DIAGNOSIS — K700 Alcoholic fatty liver: Secondary | ICD-10-CM | POA: Diagnosis not present

## 2023-11-24 DIAGNOSIS — F1010 Alcohol abuse, uncomplicated: Secondary | ICD-10-CM | POA: Insufficient documentation

## 2023-11-24 DIAGNOSIS — K2920 Alcoholic gastritis without bleeding: Secondary | ICD-10-CM | POA: Diagnosis not present

## 2023-11-24 DIAGNOSIS — Z23 Encounter for immunization: Secondary | ICD-10-CM | POA: Diagnosis not present

## 2023-11-24 DIAGNOSIS — Z789 Other specified health status: Secondary | ICD-10-CM

## 2023-11-24 DIAGNOSIS — K2970 Gastritis, unspecified, without bleeding: Secondary | ICD-10-CM | POA: Diagnosis present

## 2023-11-24 DIAGNOSIS — M6281 Muscle weakness (generalized): Secondary | ICD-10-CM | POA: Diagnosis not present

## 2023-11-24 DIAGNOSIS — R0602 Shortness of breath: Secondary | ICD-10-CM | POA: Insufficient documentation

## 2023-11-24 DIAGNOSIS — R0689 Other abnormalities of breathing: Secondary | ICD-10-CM | POA: Diagnosis not present

## 2023-11-24 DIAGNOSIS — R7989 Other specified abnormal findings of blood chemistry: Secondary | ICD-10-CM

## 2023-11-24 DIAGNOSIS — Z794 Long term (current) use of insulin: Secondary | ICD-10-CM | POA: Insufficient documentation

## 2023-11-24 DIAGNOSIS — E785 Hyperlipidemia, unspecified: Secondary | ICD-10-CM | POA: Diagnosis not present

## 2023-11-24 DIAGNOSIS — K760 Fatty (change of) liver, not elsewhere classified: Secondary | ICD-10-CM | POA: Diagnosis not present

## 2023-11-24 DIAGNOSIS — F1090 Alcohol use, unspecified, uncomplicated: Secondary | ICD-10-CM | POA: Insufficient documentation

## 2023-11-24 DIAGNOSIS — Z79899 Other long term (current) drug therapy: Secondary | ICD-10-CM | POA: Insufficient documentation

## 2023-11-24 LAB — LACTIC ACID, PLASMA
Lactic Acid, Venous: 3.1 mmol/L (ref 0.5–1.9)
Lactic Acid, Venous: 3.2 mmol/L (ref 0.5–1.9)

## 2023-11-24 LAB — I-STAT VENOUS BLOOD GAS, ED
Acid-Base Excess: 3 mmol/L — ABNORMAL HIGH (ref 0.0–2.0)
Bicarbonate: 27.7 mmol/L (ref 20.0–28.0)
Calcium, Ion: 1.04 mmol/L — ABNORMAL LOW (ref 1.15–1.40)
HCT: 43 % (ref 36.0–46.0)
Hemoglobin: 14.6 g/dL (ref 12.0–15.0)
O2 Saturation: 88 %
Potassium: 4.5 mmol/L (ref 3.5–5.1)
Sodium: 130 mmol/L — ABNORMAL LOW (ref 135–145)
TCO2: 29 mmol/L (ref 22–32)
pCO2, Ven: 40.5 mmHg — ABNORMAL LOW (ref 44–60)
pH, Ven: 7.443 — ABNORMAL HIGH (ref 7.25–7.43)
pO2, Ven: 52 mmHg — ABNORMAL HIGH (ref 32–45)

## 2023-11-24 LAB — I-STAT CHEM 8, ED
BUN: 10 mg/dL (ref 6–20)
Calcium, Ion: 1.04 mmol/L — ABNORMAL LOW (ref 1.15–1.40)
Chloride: 96 mmol/L — ABNORMAL LOW (ref 98–111)
Creatinine, Ser: 1 mg/dL (ref 0.44–1.00)
Glucose, Bld: 382 mg/dL — ABNORMAL HIGH (ref 70–99)
HCT: 44 % (ref 36.0–46.0)
Hemoglobin: 15 g/dL (ref 12.0–15.0)
Potassium: 4.5 mmol/L (ref 3.5–5.1)
Sodium: 131 mmol/L — ABNORMAL LOW (ref 135–145)
TCO2: 26 mmol/L (ref 22–32)

## 2023-11-24 LAB — COMPREHENSIVE METABOLIC PANEL WITH GFR
ALT: 208 U/L — ABNORMAL HIGH (ref 0–44)
AST: 266 U/L — ABNORMAL HIGH (ref 15–41)
Albumin: 2.5 g/dL — ABNORMAL LOW (ref 3.5–5.0)
Alkaline Phosphatase: 94 U/L (ref 38–126)
Anion gap: 12 (ref 5–15)
BUN: 9 mg/dL (ref 6–20)
CO2: 24 mmol/L (ref 22–32)
Calcium: 8.2 mg/dL — ABNORMAL LOW (ref 8.9–10.3)
Chloride: 95 mmol/L — ABNORMAL LOW (ref 98–111)
Creatinine, Ser: 0.99 mg/dL (ref 0.44–1.00)
GFR, Estimated: >60 mL/min (ref >60)
Glucose, Bld: 378 mg/dL — ABNORMAL HIGH (ref 70–99)
Potassium: 4.4 mmol/L (ref 3.5–5.1)
Sodium: 131 mmol/L — ABNORMAL LOW (ref 135–145)
Total Bilirubin: 1.7 mg/dL — ABNORMAL HIGH (ref 0.0–1.2)
Total Protein: 5.8 g/dL — ABNORMAL LOW (ref 6.5–8.1)

## 2023-11-24 LAB — CBC WITH DIFFERENTIAL/PLATELET
Abs Immature Granulocytes: 0.01 K/uL (ref 0.00–0.07)
Basophils Absolute: 0.1 K/uL (ref 0.0–0.1)
Basophils Relative: 2 %
Eosinophils Absolute: 0.1 K/uL (ref 0.0–0.5)
Eosinophils Relative: 1 %
HCT: 36.1 % (ref 36.0–46.0)
Hemoglobin: 13.1 g/dL (ref 12.0–15.0)
Immature Granulocytes: 0 %
Lymphocytes Relative: 42 %
Lymphs Abs: 1.8 K/uL (ref 0.7–4.0)
MCH: 33.5 pg (ref 26.0–34.0)
MCHC: 36.3 g/dL — ABNORMAL HIGH (ref 30.0–36.0)
MCV: 92.3 fL (ref 80.0–100.0)
Monocytes Absolute: 0.4 K/uL (ref 0.1–1.0)
Monocytes Relative: 9 %
Neutro Abs: 1.9 K/uL (ref 1.7–7.7)
Neutrophils Relative %: 46 %
Platelets: 178 K/uL (ref 150–400)
RBC: 3.91 MIL/uL (ref 3.87–5.11)
RDW: 14.9 % (ref 11.5–15.5)
WBC: 4.2 K/uL (ref 4.0–10.5)
nRBC: 0 % (ref 0.0–0.2)

## 2023-11-24 LAB — URINALYSIS, W/ REFLEX TO CULTURE (INFECTION SUSPECTED)
Bilirubin Urine: NEGATIVE
Glucose, UA: >=500 mg/dL — AB
Ketones, ur: NEGATIVE mg/dL
Nitrite: NEGATIVE
Protein, ur: NEGATIVE mg/dL
Specific Gravity, Urine: 1.025 (ref 1.005–1.030)
pH: 5 (ref 5.0–8.0)

## 2023-11-24 LAB — RESP PANEL BY RT-PCR (RSV, FLU A&B, COVID)  RVPGX2
Influenza A by PCR: NEGATIVE
Influenza B by PCR: NEGATIVE
Resp Syncytial Virus by PCR: NEGATIVE
SARS Coronavirus 2 by RT PCR: NEGATIVE

## 2023-11-24 LAB — CBG MONITORING, ED
Glucose-Capillary: 335 mg/dL — ABNORMAL HIGH (ref 70–99)
Glucose-Capillary: 316 mg/dL — ABNORMAL HIGH (ref 70–99)

## 2023-11-24 LAB — T4, FREE: Free T4: 0.84 ng/dL (ref 0.61–1.12)

## 2023-11-24 LAB — TROPONIN I (HIGH SENSITIVITY)
Troponin I (High Sensitivity): 5 ng/L (ref <18)
Troponin I (High Sensitivity): 6 ng/L (ref <18)

## 2023-11-24 LAB — LIPASE, BLOOD: Lipase: 19 U/L (ref 11–51)

## 2023-11-24 LAB — BRAIN NATRIURETIC PEPTIDE: B Natriuretic Peptide: 31 pg/mL (ref 0.0–100.0)

## 2023-11-24 LAB — TSH: TSH: 1.605 u[IU]/mL (ref 0.350–4.500)

## 2023-11-24 LAB — CK: Total CK: 115 U/L (ref 38–234)

## 2023-11-24 MED ORDER — THIAMINE MONONITRATE 100 MG PO TABS
100.0000 mg | ORAL_TABLET | Freq: Every day | ORAL | Status: DC
  Administered 2023-11-25 – 2023-11-26 (×2): 100 mg via ORAL
  Filled 2023-11-24 (×2): qty 1

## 2023-11-24 MED ORDER — DIAZEPAM 5 MG/ML IJ SOLN
2.5000 mg | Freq: Once | INTRAMUSCULAR | Status: AC
  Administered 2023-11-24: 2.5 mg via INTRAVENOUS
  Filled 2023-11-24: qty 2

## 2023-11-24 MED ORDER — SODIUM CHLORIDE 0.9 % IV BOLUS
1000.0000 mL | Freq: Once | INTRAVENOUS | Status: AC
  Administered 2023-11-24: 1000 mL via INTRAVENOUS

## 2023-11-24 MED ORDER — LIDOCAINE VISCOUS HCL 2 % MT SOLN
15.0000 mL | Freq: Once | OROMUCOSAL | Status: AC
  Administered 2023-11-24: 15 mL via ORAL
  Filled 2023-11-24: qty 15

## 2023-11-24 MED ORDER — PANTOPRAZOLE SODIUM 40 MG PO TBEC
40.0000 mg | DELAYED_RELEASE_TABLET | Freq: Every day | ORAL | Status: DC
  Administered 2023-11-25 – 2023-11-26 (×3): 40 mg via ORAL
  Filled 2023-11-24 (×3): qty 1

## 2023-11-24 MED ORDER — ENOXAPARIN SODIUM 40 MG/0.4ML IJ SOSY
40.0000 mg | PREFILLED_SYRINGE | INTRAMUSCULAR | Status: DC
  Administered 2023-11-25 – 2023-11-26 (×2): 40 mg via SUBCUTANEOUS
  Filled 2023-11-24 (×2): qty 0.4

## 2023-11-24 MED ORDER — LORAZEPAM 1 MG PO TABS
0.5000 mg | ORAL_TABLET | Freq: Once | ORAL | Status: AC
  Administered 2023-11-24: 0.5 mg via ORAL
  Filled 2023-11-24: qty 1

## 2023-11-24 MED ORDER — SODIUM CHLORIDE 0.9 % IV SOLN
1.0000 g | Freq: Once | INTRAVENOUS | Status: AC
  Administered 2023-11-24: 1 g via INTRAVENOUS
  Filled 2023-11-24: qty 10

## 2023-11-24 MED ORDER — SODIUM CHLORIDE 0.9 % IV SOLN: INTRAVENOUS | Status: DC

## 2023-11-24 MED ORDER — FOLIC ACID 1 MG PO TABS
1.0000 mg | ORAL_TABLET | Freq: Every day | ORAL | Status: DC
  Administered 2023-11-25 – 2023-11-26 (×2): 1 mg via ORAL
  Filled 2023-11-24 (×2): qty 1

## 2023-11-24 MED ORDER — ONDANSETRON HCL 4 MG/2ML IJ SOLN
4.0000 mg | Freq: Once | INTRAMUSCULAR | Status: AC
  Administered 2023-11-24: 4 mg via INTRAVENOUS
  Filled 2023-11-24: qty 2

## 2023-11-24 MED ORDER — ACETAMINOPHEN 500 MG PO TABS: 500.0000 mg | ORAL_TABLET | Freq: Four times a day (QID) | ORAL | Status: DC | PRN

## 2023-11-24 MED ORDER — EZETIMIBE 10 MG PO TABS
10.0000 mg | ORAL_TABLET | Freq: Every day | ORAL | Status: DC
  Administered 2023-11-25 – 2023-11-26 (×2): 10 mg via ORAL
  Filled 2023-11-24 (×2): qty 1

## 2023-11-24 MED ORDER — THIAMINE HCL 100 MG/ML IJ SOLN: 100.0000 mg | Freq: Every day | INTRAMUSCULAR | Status: DC

## 2023-11-24 MED ORDER — INSULIN ASPART 100 UNIT/ML IJ SOLN
0.0000 [IU] | Freq: Three times a day (TID) | INTRAMUSCULAR | Status: DC
  Administered 2023-11-25: 5 [IU] via SUBCUTANEOUS
  Administered 2023-11-25: 3 [IU] via SUBCUTANEOUS
  Administered 2023-11-25 – 2023-11-26 (×2): 5 [IU] via SUBCUTANEOUS
  Administered 2023-11-26: 3 [IU] via SUBCUTANEOUS

## 2023-11-24 MED ORDER — ADULT MULTIVITAMIN W/MINERALS CH
1.0000 | ORAL_TABLET | Freq: Every day | ORAL | Status: DC
  Administered 2023-11-25 – 2023-11-26 (×2): 1 via ORAL
  Filled 2023-11-24 (×2): qty 1

## 2023-11-24 MED ORDER — ALUM & MAG HYDROXIDE-SIMETH 200-200-20 MG/5ML PO SUSP
30.0000 mL | Freq: Once | ORAL | Status: AC
  Administered 2023-11-24: 30 mL via ORAL
  Filled 2023-11-24: qty 30

## 2023-11-24 MED ORDER — IOHEXOL 350 MG/ML SOLN
75.0000 mL | Freq: Once | INTRAVENOUS | Status: AC | PRN
  Administered 2023-11-24: 75 mL via INTRAVENOUS

## 2023-11-24 MED ORDER — LACTATED RINGERS IV BOLUS
1000.0000 mL | Freq: Once | INTRAVENOUS | Status: AC
  Administered 2023-11-24: 1000 mL via INTRAVENOUS

## 2023-11-24 MED ORDER — PANTOPRAZOLE SODIUM 40 MG PO TBEC
40.0000 mg | DELAYED_RELEASE_TABLET | Freq: Once | ORAL | Status: AC
  Administered 2023-11-24: 40 mg via ORAL
  Filled 2023-11-24: qty 1

## 2023-11-24 NOTE — ED Provider Notes (Signed)
 [place] [facility] Provider Note  CSN: [id number] Arrival date & time: 11/24/23 1240  Chief Complaint(s) Shortness of Breath  HPI Linda Livingston is a 58 y.o. female with past medical history as below, significant for DM2, hyperlipidemia who presents to the ED with complaint of chest pain, palpitations  Patient reports just prior to arrival she began having midsternal chest pain that does not radiate, described as a tightness, cramping sensation.  Having palpitations.  Transient dyspnea has since improved.  No abdominal pain nausea or vomiting, no syncope or near syncope.  No fever or chills, no recent travel or sick contacts, history of VTE, no leg swelling.  No medication prior to arrival  Past Medical History Past Medical History:  Diagnosis Date   Diabetes mellitus without complication (HCC)    Patient Active Problem List   Diagnosis Date Noted   Hyperlipidemia 12/02/2017   Type 2 diabetes mellitus without complication, without long-term current use of insulin  (HCC) 10/05/2017   Elevated lipoprotein(a) 10/05/2017   Pre-ulcerative corn or callous 09/08/2017   Glaucoma suspect of both eyes 03/26/2017   Congenital hypertrophy of retinal pigment epithelium 03/26/2017   Home Medication(s) Prior to Admission medications   Medication Sig Start Date End Date Taking? Authorizing Provider  Blood Glucose Monitoring Suppl (ONETOUCH VERIO) w/Device KIT 1 Units by Does not apply route daily. 09/30/17   Delane Lye, MD  ezetimibe  (ZETIA ) 10 MG tablet TAKE 1 TABLET(10 MG) BY MOUTH DAILY 05/11/23   Diona Perkins, MD  glucose blood (ONETOUCH VERIO) test strip TEST 4 TIMES A DAY BEFORE MEALS AND AT BEDTIME 08/18/23   Diona Perkins, MD  Lancet Devices (ONE TOUCH DELICA LANCING DEV) MISC 1 Units by Does not apply route daily. 09/30/17   Delane Lye, MD  metFORMIN  (GLUCOPHAGE ) 500 MG tablet TAKE 1 TABLET(500 MG) BY MOUTH TWICE DAILY WITH A MEAL 08/18/23   Diona Perkins, MD  OneTouch Delica Lancets 33G MISC 1 Units by Does not apply route 4 (four) times daily -  before meals and at bedtime for 30 days. 05/21/18 09/22/23  Delane Lye, MD  rosuvastatin  (CRESTOR ) 20 MG tablet TAKE 1 TABLET(20 MG) BY MOUTH DAILY 10/20/23   Diona Perkins, MD                                                                                                                                    Past Surgical History History reviewed. No pertinent surgical history. Family History Family History  Problem Relation Age of Onset   Breast cancer Neg Hx     Social History Social History   Tobacco Use   Smoking status: Some Days    Passive exposure: Current   Smokeless tobacco: Never  Substance Use Topics   Alcohol use: Yes    Alcohol/week: 1.0 standard drink of alcohol    Types: 1 Cans of beer per week  Comment: occas   Drug use: Never   Allergies Patient has no known allergies.  Review of Systems A thorough review of systems was obtained and all systems are negative except as noted in the HPI and PMH.   Physical Exam Vital Signs  I have reviewed the triage vital signs BP (!) 158/84   Pulse (!) 107   Temp 98 F (36.7 C)   Resp 16   Ht 5' 5 (1.651 m)   Wt 78.9 kg   SpO2 100%   BMI 28.96 kg/m  Physical Exam Vitals and nursing note reviewed.  Constitutional:      General: She is not in acute distress.    Appearance: Normal appearance. She is obese.  HENT:     Head: Normocephalic and atraumatic.     Right Ear: External ear normal.     Left Ear: External ear normal.     Nose: Nose normal.     Mouth/Throat:     Mouth: Mucous membranes are moist.  Eyes:     General: No scleral icterus.       Right eye: No discharge.        Left eye: No discharge.  Cardiovascular:     Rate and Rhythm: Regular rhythm. Tachycardia present.     Pulses: Normal pulses.     Heart sounds: Normal heart sounds.  Pulmonary:     Effort: Pulmonary effort is normal. No respiratory  distress.     Breath sounds: Normal breath sounds. No stridor.  Abdominal:     General: Abdomen is flat. There is no distension.     Palpations: Abdomen is soft.     Tenderness: There is no abdominal tenderness.  Musculoskeletal:     Cervical back: No rigidity.     Right lower leg: No edema.     Left lower leg: No edema.  Skin:    General: Skin is warm and dry.     Capillary Refill: Capillary refill takes less than 2 seconds.  Neurological:     Mental Status: She is alert.  Psychiatric:        Mood and Affect: Mood normal.        Behavior: Behavior normal. Behavior is cooperative.     ED Results and Treatments Labs (all labs ordered are listed, but only abnormal results are displayed) Labs Reviewed  CBC WITH DIFFERENTIAL/PLATELET - Abnormal; Notable for the following components:      Result Value   MCHC 36.3 (*)    All other components within normal limits  COMPREHENSIVE METABOLIC PANEL WITH GFR - Abnormal; Notable for the following components:   Sodium 131 (*)    Chloride 95 (*)    Glucose, Bld 378 (*)    Calcium  8.2 (*)    Total Protein 5.8 (*)    Albumin 2.5 (*)    AST 266 (*)    ALT 208 (*)    Total Bilirubin 1.7 (*)    All other components within normal limits  I-STAT VENOUS BLOOD GAS, ED - Abnormal; Notable for the following components:   pH, Ven 7.443 (*)    pCO2, Ven 40.5 (*)    pO2, Ven 52 (*)    Acid-Base Excess 3.0 (*)    Sodium 130 (*)    Calcium , Ion 1.04 (*)    All other components within normal limits  I-STAT CHEM 8, ED - Abnormal; Notable for the following components:   Sodium 131 (*)    Chloride 96 (*)  Glucose, Bld 382 (*)    Calcium , Ion 1.04 (*)    All other components within normal limits  RESP PANEL BY RT-PCR (RSV, FLU A&B, COVID)  RVPGX2  BRAIN NATRIURETIC PEPTIDE  LIPASE, BLOOD  TSH  T4, FREE  TROPONIN I (HIGH SENSITIVITY)  TROPONIN I (HIGH SENSITIVITY)                                                                                                                           Radiology CT Angio Chest PE W and/or Wo Contrast Result Date: 11/24/2023 CLINICAL DATA:  Concern for pulmonary embolus EXAM: CT ANGIOGRAPHY CHEST WITH CONTRAST TECHNIQUE: Multidetector CT imaging of the chest was performed using the standard protocol during bolus administration of intravenous contrast. Multiplanar CT image reconstructions and MIPs were obtained to evaluate the vascular anatomy. RADIATION DOSE REDUCTION: This exam was performed according to the departmental dose-optimization program which includes automated exposure control, adjustment of the mA and/or kV according to patient size and/or use of iterative reconstruction technique. CONTRAST:  75mL OMNIPAQUE  IOHEXOL  350 MG/ML SOLN COMPARISON:  Chest radiograph dated 11/24/2023. FINDINGS: Cardiovascular: There is no cardiomegaly or pericardial effusion. The thoracic aorta is unremarkable. There is a left-sided aortic arch with an aberrant right subclavian artery anatomy. The origins of the great vessels of the aortic arch appear patent. No pulmonary artery embolus identified. Mediastinum/Nodes: No hilar or mediastinal adenopathy. The esophagus and the thyroid gland are grossly unremarkable as visualized. No mediastinal fluid collection. Lungs/Pleura: No focal consolidation, pleural effusion, or pneumothorax. The central airways are patent. Upper Abdomen: Fatty liver. Musculoskeletal: No acute osseous pathology. Review of the MIP images confirms the above findings. IMPRESSION: 1. No acute intrathoracic pathology. No CT evidence of pulmonary artery embolus. 2. Fatty liver. Electronically Signed   By: Vanetta Chou M.D.   On: 11/24/2023 15:07   DG Chest Port 1 View Result Date: 11/24/2023 EXAM: 1 VIEW XRAY OF THE CHEST 11/24/2023 01:06:30 PM COMPARISON: None available. CLINICAL HISTORY: DIB. SOB; ROVER FINDINGS: LUNGS AND PLEURA: No focal pulmonary opacity. No pulmonary edema. No pleural effusion. No  pneumothorax. HEART AND MEDIASTINUM: No acute abnormality of the cardiac and mediastinal silhouettes. BONES AND SOFT TISSUES: No acute osseous abnormality. IMPRESSION: 1. No acute process. Electronically signed by: Waddell Calk MD 11/24/2023 01:22 PM EDT RP Workstation: [id number]    Pertinent labs & imaging results that were available during my care of the patient were reviewed by me and considered in my medical decision making (see MDM for details).  Medications Ordered in ED Medications  ondansetron  (ZOFRAN ) injection 4 mg (has no administration in time range)  alum & mag hydroxide-simeth (MAALOX/MYLANTA) 200-200-20 MG/5ML suspension 30 mL (30 mLs Oral Given 11/24/23 1342)    And  lidocaine  (XYLOCAINE ) 2 % viscous mouth solution 15 mL (15 mLs Oral Given 11/24/23 1341)  sodium chloride  0.9 % bolus 1,000 mL (1,000 mLs Intravenous New Bag/Given 11/24/23 1517)  iohexol  (OMNIPAQUE ) 350 MG/ML injection 75 mL (75  mLs Intravenous Contrast Given 11/24/23 1429)                                                                                                                                     Procedures Procedures  (including critical care time)  Medical Decision Making / ED Course    Medical Decision Making:    Linda Livingston is a 58 y.o. female with past medical history as below, significant for DM2, hyperlipidemia who presents to the ED with complaint of chest pain, palpitations. The complaint involves an extensive differential diagnosis and also carries with it a high risk of complications and morbidity.  Serious etiology was considered. Ddx includes but is not limited to: Differential includes all life-threatening causes for chest pain. This includes but is not exclusive to acute coronary syndrome, aortic dissection, pulmonary embolism, cardiac tamponade, community-acquired pneumonia, pericarditis, musculoskeletal chest wall pain, etc.   Complete initial physical exam performed, notably the  patient was in NAD, no hypoxia, tachycardia noted.    Reviewed and confirmed nursing documentation for past medical history, family history, social history.  Vital signs reviewed.    Chest pain Tachycardia > - Patient with chest pain/tightness, palpitations, dyspnea just prior to arrival. - CTPE was stable  - trop x1 negative, EKG w/ sinus tachy - labs o/w stable -  Pt reports improved symptoms following maalox  Pt pending lipase and delta trop, tsh at time of shift change.                     Additional history obtained: -Additional history obtained from ems -External records from outside source obtained and reviewed including: Chart review including previous notes, labs, imaging, consultation notes including  Home meds   Lab Tests: -I ordered, reviewed, and interpreted labs.   The pertinent results include:   Labs Reviewed  CBC WITH DIFFERENTIAL/PLATELET - Abnormal; Notable for the following components:      Result Value   MCHC 36.3 (*)    All other components within normal limits  COMPREHENSIVE METABOLIC PANEL WITH GFR - Abnormal; Notable for the following components:   Sodium 131 (*)    Chloride 95 (*)    Glucose, Bld 378 (*)    Calcium  8.2 (*)    Total Protein 5.8 (*)    Albumin 2.5 (*)    AST 266 (*)    ALT 208 (*)    Total Bilirubin 1.7 (*)    All other components within normal limits  I-STAT VENOUS BLOOD GAS, ED - Abnormal; Notable for the following components:   pH, Ven 7.443 (*)    pCO2, Ven 40.5 (*)    pO2, Ven 52 (*)    Acid-Base Excess 3.0 (*)    Sodium 130 (*)    Calcium , Ion 1.04 (*)    All other components within normal limits  I-STAT CHEM 8, ED - Abnormal; Notable for the following  components:   Sodium 131 (*)    Chloride 96 (*)    Glucose, Bld 382 (*)    Calcium , Ion 1.04 (*)    All other components within normal limits  RESP PANEL BY RT-PCR (RSV, FLU A&B, COVID)  RVPGX2  BRAIN NATRIURETIC PEPTIDE  LIPASE, BLOOD  TSH  T4,  FREE  TROPONIN I (HIGH SENSITIVITY)  TROPONIN I (HIGH SENSITIVITY)    Notable for labs stable so far  EKG   EKG Interpretation Date/Time:  Tuesday November 24 2023 12:55:09 EDT Ventricular Rate:  107 PR Interval:  135 QRS Duration:  82 QT Interval:  336 QTC Calculation: 449 R Axis:   53  Text Interpretation: Sinus tachycardia Right atrial enlargement Confirmed by Elnor Savant ([id number]) on 11/24/2023 12:57:33 PM         Imaging Studies ordered: I ordered imaging studies including CXR CTPE I independently visualized the following imaging with scope of interpretation limited to determining acute life threatening conditions related to emergency care; findings noted above I agree with the radiologist interpretation If any imaging was obtained with contrast I closely monitored patient for any possible adverse reaction a/w contrast administration in the emergency department   Medicines ordered and prescription drug management: Meds ordered this encounter  Medications   AND Linked Order Group    alum & mag hydroxide-simeth (MAALOX/MYLANTA) 200-200-20 MG/5ML suspension 30 mL    lidocaine  (XYLOCAINE ) 2 % viscous mouth solution 15 mL   sodium chloride  0.9 % bolus 1,000 mL   iohexol  (OMNIPAQUE ) 350 MG/ML injection 75 mL   ondansetron  (ZOFRAN ) injection 4 mg    -I have reviewed the patients home medicines and have made adjustments as needed   Consultations Obtained: na   Cardiac Monitoring: The patient was maintained on a cardiac monitor.  I personally viewed and interpreted the cardiac monitored which showed an underlying rhythm of: sinus tachy Continuous pulse oximetry interpreted by myself, 100% on ra.    Social Determinants of Health:  Diagnosis or treatment significantly limited by social determinants of health: current smoker and alcohol use   Reevaluation: After the interventions noted above, I reevaluated the patient and found that they have improved  Co morbidities  that complicate the patient evaluation  Past Medical History:  Diagnosis Date   Diabetes mellitus without complication (HCC)       Dispostion: Disposition decision including need for hospitalization was considered, and patient disposition pending at time of sign out.    Final Clinical Impression(s) / ED Diagnoses Final diagnoses:  Tachycardia        Elnor Savant LABOR, DO 11/24/23 1552

## 2023-11-24 NOTE — Assessment & Plan Note (Signed)
 In the context of increased alcohol use suggests most likely alcoholic gastritis and hepatitis with negative CTA and troponins.  Hepatic steatosis seen on CT renal stone study consistent with chronic alcohol use with elevated LFTs.  Infectious etiology less likely given no source.  Pending PT/INR to calculate Maddrey and MELD scores. -Phos, mag, hepatitis panel, PT/INR -BC/CMP to trend LFTs -Start pantoprazole  40 mg, initial dose tonight but start daily -Added Tylenol  500 mg every 6 hours as needed for mild to moderate pain, low-dose for transaminitis -Holding rosuvastatin  until improving LFTs -CIWA protocol -Start multivitamin, thiamine , and folic acid  -Holding ceftriaxone  with pending urine culture and BCX X2

## 2023-11-24 NOTE — Hospital Course (Signed)
 Linda Livingston is a 58 y.o.female with a history of T2DM, alcohol use disorder, HLD, tobacco use who was admitted to the family medicine teaching service at [facility] for persistent chest tightness with dyspnea. Her hospital course is detailed below:   Chest pain at rest Alcohol use disorder Patient presented to the ED with chief complaint of chest pain and dyspnea. PE and ACS workup was unremarkable. Patient suspected to have alcoholic gastritis in the setting of reported increased alcohol use. Patient was also found to have elevated lactic acid of 3.1 and elevated LFTs likely secondary to alcohol use. Lactic acidosis and LFT's improved after receiving IV fluids. Patient started on PPI with symptomatic improvement. Patient placed on CIWA protocol, and did not have any signs or symptoms of acute withdrawal during admission. Patient was given resources for alcohol cessation in discharge paperwork.    Other chronic conditions were medically managed with home medications and formulary alternatives as necessary (hyperlipidemia, type 2 diabetes mellitus).   PCP Follow-up Recommendations: Recheck LFTs and bilirubin with CMP Consider RUQ US 

## 2023-11-24 NOTE — Assessment & Plan Note (Signed)
 Initial lactic acid 3.1, repeat 2 hours later 3.2.  Anion gap 12 on initial CMP which is upper limit of normal.  Most likely secondary to hepatitis, but may be secondary to metformin  though renal function is good -Will continue to trend until decreasing. -Started 100 mL/h NS maintenance fluid (sodium 131)

## 2023-11-24 NOTE — ED Provider Notes (Signed)
  Physical Exam  BP (!) 158/84   Pulse (!) 107   Temp 98 F (36.7 C)   Resp 16   Ht 5' 5 (1.651 m)   Wt 78.9 kg   SpO2 100%   BMI 28.96 kg/m   Physical Exam  Procedures  Procedures  ED Course / MDM    Medical Decision Making Amount and/or Complexity of Data Reviewed Labs: ordered. Radiology: ordered.  Risk OTC drugs. Prescription drug management.   ***midsternal chest pain 30 minutes prior to arrival, no radiation.  Feeling improved.  Hyperglycemia, mild transaminitis. NO fever.  Lipase and delta troponin pending, COVID/flu/RSV pending as well.

## 2023-11-24 NOTE — ED Triage Notes (Signed)
 Pt arrive to ED via EMS.  Called to [phone] was made due to [facility] that started this morning but time [facility] started is unknown. When EMS arrived to the house pt walked out to the truck and helped herself on the stretcher. Initial vitals was 174/78 HR 140 EKG shows sinus tach. Once EMS got her on the stretcher and situated she continued to be hypertensive and HR came down to 106. No complains of chest pain , SHOB or dizziness while with EMS. EMS placed a 20g to R AC. Denied drug use to EMS but did say she had alcohol last night. CBG 411

## 2023-11-24 NOTE — H&P (Cosign Needed)
 Hospital Admission History and Physical Service Pager: [phone]  Patient name: Linda Livingston Medical record number: [id number] Date of Birth: [birth year] Age: 58 y.o. Gender: female  Primary Care Provider: Diona Perkins, MD Consultants: None Code Status: Full code Preferred Emergency Contact:  Contact Information     Name Relation Home Work [place] Mother [phone]  [phone]      Other Contacts   None on File      Chief Complaint: Chest pain  Assessment and Plan: Linda Livingston is a 58 y.o. female with PMHx T2DM and HLD presenting with chest pain.   Differential for presentation of this includes alcoholic gastritis/hepatitis, given elevated LFTs, recent increased alcohol use in the context of chronic AUD, elevated lactic acid, constant chest tightness with unremarkable ACS and PE workup.  ED provider concerned about sepsis secondary to urinary tract infection given UA findings though less likely as asymptomatic.  Other less likely diagnoses include PE which was a concern with the tachycardia and chest pain but given negative CTA and CXR there is low concern for this.  ACS because of chest pain and tachycardia, but less likely with high-sensitivity troponin of 5 with no acute findings on EKG.  Assessment & Plan Chest pain at rest Alcohol use disorder In the context of increased alcohol use suggests most likely alcoholic gastritis and hepatitis with negative CTA and troponins.  Hepatic steatosis seen on CT renal stone study consistent with chronic alcohol use with elevated LFTs.  Infectious etiology less likely given no source.  Pending PT/INR to calculate Maddrey and MELD scores. -Phos, mag, hepatitis panel, PT/INR -BC/CMP to trend LFTs -Start pantoprazole  40 mg, initial dose tonight but start daily -Added Tylenol  500 mg every 6 hours as needed for mild to moderate pain, low-dose for transaminitis -Holding rosuvastatin  until improving LFTs -CIWA  protocol -Start multivitamin, thiamine , and folic acid  -Holding ceftriaxone  with pending urine culture and BCX X2 Lactic acid blood increased Initial lactic acid 3.1, repeat 2 hours later 3.2.  Anion gap 12 on initial CMP which is upper limit of normal.  Most likely secondary to alcohol use -Will continue to trend until decreasing. -Started 100 mL/h NS maintenance fluid (sodium 131)  Chronic and Stable Problems: Hyperlipidemia: Continue home ezetimibe , holding rosuvastatin  due to transaminitis T2DM: Holding metformin . sSSI + CBGs. A1c ordered   FEN/GI: Carb modified VTE Prophylaxis: Lovenox   Disposition: Med tele  History of Present Illness:  Linda Livingston is a 58 y.o. female presenting with chest pain.  Reports onset of midsternal chest pain described as tightness/cramping just prior to arrival to the ED. Started after she went to store and came back. Reports the SOB brought her to the ED. No prior similar events. Denies diaphoresis. Additionally having some palpitations and shortness of breath that has since improved.  Denies abdominal pain and N/V/D.  Denies fever and sick contacts.  No history of the symptoms before today.  Reports her chest pain is still coming and going in the same area. Happens for a few minutes at a time. Not worse with exertion. Denies fever and chills. Denis dysuria and urinary frequency. Denies hematuria.  Denies h/o anxiety or panic attacks. Denis h/o tachycardia. Last menstrual cycle when she was 58 yo. Denies GERD. Denies nephrolithiasis.  In the ED, initiated chest pain workup with negative troponins.  CT PE and CT renal study negative for acute process.  Had elevated lactic acid 3.1 without anion gap was given 1L bolus  NS and 1L bolus LR, repeat was 3.2.  Concern for sepsis and started workup, gave 1 dose of ceftriaxone .  Review Of Systems: Per HPI  Pertinent Past Medical History: T2DM HLD Remainder reviewed in history tab.   Pertinent Past  Surgical History: Gallstone surgery (?) - at [facility] a few years ago Remainder reviewed in history tab.   Pertinent Social History: Tobacco use: Former, quit 2 months ago. Mostly black and milds, 2/day.  Alcohol use: Drinking beer, daily to every other day. 6 beers at a time. Sometimes will drink more than that, thinks she has had more recently and gone overboard. Last drink last night (9/8). Denies drinking liquor. Planning to cut back. Started drinking around 58 yo Other Substance use: Stopped THC in 2009. Denies other substance use Lives with partner  Pertinent Family History: None Remainder reviewed in history tab.   Important Outpatient Medications: *Did not take meds today  Ezetimibe  10mg  daily Metformin  500mg  daily Crestor  20mg  daily Remainder reviewed in medication history.   Objective: BP (!) 145/76   Pulse (!) 107   Temp 98.3 F (36.8 C) (Oral)   Resp (!) 22   Ht 5' 5 (1.651 m)   Wt 78.9 kg   SpO2 100%   BMI 28.96 kg/m  Exam: General: Well-appearing, eating a salad Eyes: Anicteric Cardiovascular: Tachycardic, regular rhythm, no murmurs rubs or gallops Respiratory: Clear to auscultation bilaterally, no wheezes or crackles Gastrointestinal: Nontender, active bowel sounds, negative Murphy sign, no rebound or guarding MSK: Nonedematous, no calf tenderness Derm: Dry, nonulcerated calluses/corns on bilateral feet, no ulcerations, erythema, or tenderness Psych: Well-appearing, calm, reactive affect, in no acute distress  Labs:  CBC BMET  Recent Labs  Lab 11/24/23 1247 11/24/23 1338  WBC 4.2  --   HGB 13.1 15.0  14.6  HCT 36.1 44.0  43.0  PLT 178  --    Recent Labs  Lab 11/24/23 1247 11/24/23 1338  NA 131* 131*  130*  K 4.4 4.5  4.5  CL 95* 96*  CO2 24  --   BUN 9 10  CREATININE 0.99 1.00  GLUCOSE 378* 382*  CALCIUM  8.2*  --     Pertinent additional labs: Troponin: 6 CK: 115 Lactic acid: 3.1 TSH: 1.6 UA: 500+ glucose, small  hemoglobin, moderate leukocytes, 11-22 WBCs Quad screen: Negative Glucose: 335  EKG: Tachycardia, some borderline peaked T waves   Imaging Studies Performed: CT Renal Stone Study Result Date: 11/24/2023 IMPRESSION: 1. No acute intra-abdominal or pelvic pathology. No hydronephrosis. 2. Fatty liver. 3. No bowel obstruction. Normal appendix. Electronically Signed   By: Vanetta Chou M.D.   On: 11/24/2023 21:17   CT Angio Chest PE W and/or Wo Contrast Result Date: 11/24/2023 IMPRESSION: 1. No acute intrathoracic pathology. No CT evidence of pulmonary artery embolus. 2. Fatty liver. Electronically Signed   By: Vanetta Chou M.D.   On: 11/24/2023 15:07   DG Chest Port 1 View Result Date: 11/24/2023 IMPRESSION: 1. No acute process. Electronically signed by: Waddell Calk MD 11/24/2023 01:22 PM EDT RP Workstation: SHEREE Lorrane Pac, MD 11/24/2023, 10:11 PM PGY-1, [facility] Family Medicine  FPTS Intern pager: [phone], text pages welcome Secure chat group [facility] Teaching Service   Upper Level Addendum:   I have seen and evaluated this patient along with Dr. Lorrane and reviewed the above note, making necessary revisions as appropriate.  I agree with the medical decision making and physical exam as noted  above.   Izetta Nap, DO PGY-3, [facility] Family Medicine Residency

## 2023-11-25 DIAGNOSIS — Z789 Other specified health status: Secondary | ICD-10-CM

## 2023-11-25 DIAGNOSIS — R079 Chest pain, unspecified: Secondary | ICD-10-CM

## 2023-11-25 LAB — COMPREHENSIVE METABOLIC PANEL WITH GFR
ALT: 170 U/L — ABNORMAL HIGH (ref 0–44)
AST: 186 U/L — ABNORMAL HIGH (ref 15–41)
Albumin: 2.1 g/dL — ABNORMAL LOW (ref 3.5–5.0)
Alkaline Phosphatase: 75 U/L (ref 38–126)
Anion gap: 9 (ref 5–15)
BUN: 8 mg/dL (ref 6–20)
CO2: 21 mmol/L — ABNORMAL LOW (ref 22–32)
Calcium: 7.4 mg/dL — ABNORMAL LOW (ref 8.9–10.3)
Chloride: 103 mmol/L (ref 98–111)
Creatinine, Ser: 1.06 mg/dL — ABNORMAL HIGH (ref 0.44–1.00)
GFR, Estimated: >60 mL/min (ref >60)
Glucose, Bld: 226 mg/dL — ABNORMAL HIGH (ref 70–99)
Potassium: 3.9 mmol/L (ref 3.5–5.1)
Sodium: 133 mmol/L — ABNORMAL LOW (ref 135–145)
Total Bilirubin: 1.5 mg/dL — ABNORMAL HIGH (ref 0.0–1.2)
Total Protein: 3.2 g/dL — ABNORMAL LOW (ref 6.5–8.1)

## 2023-11-25 LAB — GLUCOSE, CAPILLARY
Glucose-Capillary: 271 mg/dL — ABNORMAL HIGH (ref 70–99)
Glucose-Capillary: 249 mg/dL — ABNORMAL HIGH (ref 70–99)

## 2023-11-25 LAB — HEPATITIS PANEL, ACUTE
HCV Ab: NONREACTIVE
Hep A IgM: NONREACTIVE
Hep B C IgM: NONREACTIVE
Hepatitis B Surface Ag: NONREACTIVE

## 2023-11-25 LAB — CBC
HCT: 31.1 % — ABNORMAL LOW (ref 36.0–46.0)
Hemoglobin: 11.1 g/dL — ABNORMAL LOW (ref 12.0–15.0)
MCH: 33.4 pg (ref 26.0–34.0)
MCHC: 35.7 g/dL (ref 30.0–36.0)
MCV: 93.7 fL (ref 80.0–100.0)
Platelets: 137 K/uL — ABNORMAL LOW (ref 150–400)
RBC: 3.32 MIL/uL — ABNORMAL LOW (ref 3.87–5.11)
RDW: 15.4 % (ref 11.5–15.5)
WBC: 5.6 K/uL (ref 4.0–10.5)
nRBC: 0 % (ref 0.0–0.2)

## 2023-11-25 LAB — CBG MONITORING, ED
Glucose-Capillary: 236 mg/dL — ABNORMAL HIGH (ref 70–99)
Glucose-Capillary: 224 mg/dL — ABNORMAL HIGH (ref 70–99)
Glucose-Capillary: 271 mg/dL — ABNORMAL HIGH (ref 70–99)

## 2023-11-25 LAB — HEMOGLOBIN A1C
Hgb A1c MFr Bld: 7.6 % — ABNORMAL HIGH (ref 4.8–5.6)
Mean Plasma Glucose: 171.42 mg/dL

## 2023-11-25 LAB — LIPID PANEL
Cholesterol: 163 mg/dL (ref 0–200)
HDL: 34 mg/dL — ABNORMAL LOW (ref >40)
LDL Cholesterol: 61 mg/dL (ref 0–99)
Total CHOL/HDL Ratio: 4.8 ratio
Triglycerides: 342 mg/dL — ABNORMAL HIGH (ref <150)
VLDL: 68 mg/dL — ABNORMAL HIGH (ref 0–40)

## 2023-11-25 LAB — PROTIME-INR
INR: 1 (ref 0.8–1.2)
Prothrombin Time: 14.1 s (ref 11.4–15.2)

## 2023-11-25 LAB — HIV ANTIBODY (ROUTINE TESTING W REFLEX): HIV Screen 4th Generation wRfx: NONREACTIVE

## 2023-11-25 LAB — LACTIC ACID, PLASMA
Lactic Acid, Venous: 2.7 mmol/L (ref 0.5–1.9)
Lactic Acid, Venous: 2.1 mmol/L (ref 0.5–1.9)

## 2023-11-25 LAB — MAGNESIUM: Magnesium: 1.8 mg/dL (ref 1.7–2.4)

## 2023-11-25 LAB — PHOSPHORUS: Phosphorus: 2.6 mg/dL (ref 2.5–4.6)

## 2023-11-25 NOTE — Discharge Summary (Incomplete)
 Family Medicine Teaching [facility] Discharge Summary  Patient name: Linda Livingston Medical record number: [id number] Date of birth: 04-Sep-1965 Age: 58 y.o. Gender: female Date of Admission: 11/24/2023  Date of Discharge: 11/26/2023 Admitting Physician: Donald CHRISTELLA Lai, DO  Primary Care Provider: Diona Perkins, MD Consultants: None  Indication for Hospitalization: chest pain at rest  Discharge Diagnoses/Problem List:  Principal Problem for Admission: alcoholic gastritis Other Problems addressed during stay:  Lactic acidosis  Brief Hospital Course:  Linda Livingston is a 58 y.o.female with a history of T2DM, alcohol use disorder, HLD, tobacco use who was admitted to the family medicine teaching service at [facility] for persistent chest tightness with dyspnea. Her hospital course is detailed below:  Chest pain at rest Alcohol use disorder Patient presented to the ED with chief complaint of chest pain and dyspnea. PE and ACS workup was unremarkable. Patient suspected to have alcoholic gastritis in the setting of reported increased alcohol use. Patient was also found to have elevated lactic acid of 3.1 and elevated LFTs likely secondary to alcohol use. Lactic acidosis and LFT's improved after receiving IV fluids. Patient started on PPI with symptomatic improvement. Patient placed on CIWA protocol, and did not have any signs or symptoms of acute withdrawal during admission. Patient was given resources for alcohol cessation in discharge paperwork, but appears pre-contemplative towards alcohol use.  Other chronic conditions were medically managed with home medications and formulary alternatives as necessary (hyperlipidemia, type 2 diabetes mellitus).  PCP Follow-up Recommendations: Ensure LFTs and bilirubin have normalized Assess improvement in pain with PPI Repeat BMP and CBC at follow up   Disposition: Home  Discharge Condition: Stable  Discharge Exam:  Vitals:   11/26/23 0344  11/26/23 0849  BP: 108/67 129/67  Pulse: 81 96  Resp: 18 18  Temp: 97.8 F (36.6 C) 98.2 F (36.8 C)  SpO2: 99% 98%   Physical Exam: General: well-appearing, lying in bed, NAD Cardiovascular: S1/S2; normal rate and rhythm; no m/r/g Respiratory: normal effort; CTAB Abdomen: epigastrium TTP; non-distended; soft Extremities: non-edematous  Significant Procedures: None  Significant Labs and Imaging:  Recent Labs  Lab 11/24/23 1247 11/24/23 1338 11/25/23 0453  WBC 4.2  --  5.6  HGB 13.1 15.0  14.6 11.1*  HCT 36.1 44.0  43.0 31.1*  PLT 178  --  137*   Recent Labs  Lab 11/24/23 1247 11/24/23 1338 11/25/23 0453 11/26/23 0248  NA 131* 131*  130* 133* 134*  K 4.4 4.5  4.5 3.9 3.8  CL 95* 96* 103 103  CO2 24  --  21* 25  GLUCOSE 378* 382* 226* 281*  BUN 9 10 8 6   CREATININE 0.99 1.00 1.06* 1.26*  CALCIUM  8.2*  --  7.4* 7.4*  MG  --   --  1.8  --   PHOS  --   --  2.6  --   ALKPHOS 94  --  75 72  AST 266*  --  186* 93*  ALT 208*  --  170* 126*  ALBUMIN 2.5*  --  2.1* 2.0*    CT Renal Stone Study Result Date: 11/24/2023 IMPRESSION:  1. No acute intra-abdominal or pelvic pathology. No hydronephrosis.  2. Fatty liver.  3. No bowel obstruction. Normal appendix.    CT Angio Chest PE W and/or Wo Contrast Result Date: 11/24/2023 IMPRESSION:  1. No acute intrathoracic pathology. No CT evidence of pulmonary artery embolus.  2. Fatty liver.   DG Chest Port 1 View Result Date: 11/24/2023  IMPRESSION: 1. No acute process.   Discharge Medications:  Allergies as of 11/26/2023   No Known Allergies      Medication List     PAUSE taking these medications    ezetimibe  10 MG tablet Wait to take this until your doctor or other care provider tells you to start again. Commonly known as: ZETIA  TAKE 1 TABLET(10 MG) BY MOUTH DAILY   rosuvastatin  20 MG tablet Wait to take this until your doctor or other care provider tells you to start again. Commonly known as:  CRESTOR  TAKE 1 TABLET(20 MG) BY MOUTH DAILY What changed: See the new instructions.       TAKE these medications    acetaminophen  500 MG tablet Commonly known as: TYLENOL  Take 1 tablet (500 mg total) by mouth every 6 (six) hours as needed for moderate pain (pain score 4-6) or mild pain (pain score 1-3).   folic acid  1 MG tablet Commonly known as: FOLVITE  Take 1 tablet (1 mg total) by mouth daily. Start taking on: November 27, 2023   metFORMIN  500 MG tablet Commonly known as: GLUCOPHAGE  TAKE 1 TABLET(500 MG) BY MOUTH TWICE DAILY WITH A MEAL   multivitamin with minerals Tabs tablet Take 1 tablet by mouth daily. Start taking on: November 27, 2023   pantoprazole  40 MG tablet Commonly known as: PROTONIX  Take 1 tablet (40 mg total) by mouth daily. Start taking on: November 27, 2023   thiamine  100 MG tablet Commonly known as: Vitamin B-1 Take 1 tablet (100 mg total) by mouth daily. Start taking on: November 27, 2023       Discharge Instructions: Please refer to Patient Instructions section of EMR for full details.  Patient was counseled important signs and symptoms that should prompt return to medical care, changes in medications, dietary instructions, activity restrictions, and follow up appointments.   Follow-Up Appointments:  Follow-up Information     Diona Perkins, MD. Go on 12/01/2023.   Specialty: Family Medicine Why: at 3:10 PM Contact information: [address] [place] [address] [phone]                 Mannie Ashley SAILOR, MD 11/26/2023, 11:55 AM PGY-1, [facility] Family Medicine   I agree with the assessment and plan as documented above.  Stuart Redo, MD PGY-3, [facility] Family Medicine

## 2023-11-25 NOTE — Assessment & Plan Note (Addendum)
 Lactic acid 3.1 on admission. Downtrending to 2.1. Most likely secondary to alcohol use. - 100 mL/h NS maintenance fluid

## 2023-11-25 NOTE — ED Notes (Signed)
 Admitting MD at [facility].

## 2023-11-25 NOTE — Progress Notes (Signed)
     Daily Progress Note Intern Pager: [phone]  Patient name: Linda Livingston Medical record number: [id number] Date of birth: 25-Oct-1965 Age: 58 y.o. Gender: female  Primary Care Provider: Diona Perkins, MD Consultants: None Code Status: FULL  Pt Overview and Major Events to Date:  9/9 - Admitted  Linda Livingston is a 58 year old female presenting with chest pain, most likely radiating from epigastrium secondary to alcoholic gastritis and hepatitis. Pertinent PMH/PSH includes T2DM and HLD.  Assessment & Plan Chest pain at rest Alcohol use disorder Presenting symptoms occurring in the context of increased alcohol use suggests likely alcoholic gastritis and hepatitis. ACS and PE workup negative. CT findings demonstrated hepatic steatosis consistent with chronic alcohol use. LFT's and bili are downtrending.  - AM CMP - Continue pantoprazole  40 mg daily - Continue Tylenol  500 mg every 6 hours as needed for mild to moderate pain, low-dose for transaminitis - Holding rosuvastatin  until improving LFTs - CIWA protocol - Continue multivitamin, thiamine , and folic acid  -- BCX negative x 1 day; UCx in progress - Holding ceftriaxone  with pending urine culture and BCX x2 Lactic acid blood increased Lactic acid 3.1 on admission. Downtrending to 2.1. Most likely secondary to alcohol use. - 100 mL/h NS maintenance fluid Chronic health problem Hyperlipidemia - continue home ezetimibe ; holding rosuvastatin  due to transaminitis T2DM - holding metformin . SSI + CBGs. A1C 7.6   FEN/GI: Carb-modified PPx: Lovenox  Dispo:Home pending clinical improvement . Barriers include ongoing medical management.   Subjective:  Patient reports she is doing okay. She endorses moderate epigastric pain. Denies N/V/D. Denies bloody stools. Denies symptoms of alcohol withdrawal. She is eating fine. States her last drink was on Monday. Reports drinking an average of 2 or 3 32-oz beers daily. Sometimes she will split a  12-pack of beer with her friend on the weekend.  Objective: Temp:  [98 F (36.7 C)-98.3 F (36.8 C)] 98.3 F (36.8 C) (09/10 0514) Pulse Rate:  [103-130] 107 (09/10 0615) Resp:  [15-28] 17 (09/10 0720) BP: (105-179)/(55-98) 141/67 (09/10 0700) SpO2:  [99 %-100 %] 100 % (09/10 0615) Weight:  [78.9 kg] 78.9 kg (09/09 1301)  Physical Exam: General: well-appearing, lying in bed, NAD Cardiovascular: S1/S2; normal rate and rhythm; no m/r/g Respiratory: normal effort; CTAB Abdomen: epigastrium TTP; non-distended; soft Extremities: non-edematous  Laboratory: Most recent CBC Lab Results  Component Value Date   WBC 5.6 11/25/2023   HGB 11.1 (L) 11/25/2023   HCT 31.1 (L) 11/25/2023   MCV 93.7 11/25/2023   PLT 137 (L) 11/25/2023   Most recent BMP    Latest Ref Rng & Units 11/25/2023    4:53 AM  BMP  Glucose 70 - 99 mg/dL 773   BUN 6 - 20 mg/dL 8   Creatinine 9.55 - 8.99 mg/dL 8.93   Sodium 864 - 854 mmol/L 133   Potassium 3.5 - 5.1 mmol/L 3.9   Chloride 98 - 111 mmol/L 103   CO2 22 - 32 mmol/L 21   Calcium  8.9 - 10.3 mg/dL 7.4     Other pertinent labs: Lactic acid 2.1, PTT 14.1, INR 1.0, T bili 1.5, AST 186, ALT 170, A1C 7.6  Imaging/Diagnostic Tests: No new imaging  Mannie Ashley SAILOR, MD 11/25/2023, 7:24 AM  PGY-1, [place] Family Medicine FPTS Intern pager: [phone], text pages welcome Secure chat group [facility] Teaching Service

## 2023-11-25 NOTE — ED Notes (Signed)
 Up to b/r, steady gait. Alert, NAD, calm, interactive, denies sx or complaints

## 2023-11-25 NOTE — Assessment & Plan Note (Signed)
 Hyperlipidemia - continue home ezetimibe ; holding rosuvastatin  due to transaminitis T2DM - holding metformin . SSI + CBGs. A1C 7.6

## 2023-11-25 NOTE — Progress Notes (Signed)
 CSW added substance abuse resources to patient's AVS.  Niels Portugal, MSW, LCSW Transitions of Care  Clinical Social Worker II [phone]

## 2023-11-25 NOTE — Plan of Care (Signed)
   Problem: Coping: Goal: Ability to adjust to condition or change in health will improve Outcome: Progressing   Problem: Fluid Volume: Goal: Ability to maintain a balanced intake and output will improve Outcome: Progressing   Problem: Health Behavior/Discharge Planning: Goal: Ability to identify and utilize available resources and services will improve Outcome: Progressing

## 2023-11-25 NOTE — Progress Notes (Signed)
   11/25/23 2155  Vitals  BP (!) 159/71  MAP (mmHg) 94  BP Location Left Arm  BP Method Automatic  Patient Position (if appropriate) Lying  Pulse Rate 92  Pulse Rate Source Monitor  ECG Heart Rate 93  MEWS COLOR  MEWS Score Color Green  Oxygen Therapy  SpO2 100 %  MEWS Score  MEWS Temp 0  MEWS Systolic 0  MEWS Pulse 0  MEWS RR 0  MEWS LOC 0  MEWS Score 0   Patient reported dizziness after using the bathroom. Patient lying down in bed; vitals obtained. No other symptoms at this time.

## 2023-11-25 NOTE — Discharge Instructions (Signed)

## 2023-11-25 NOTE — Assessment & Plan Note (Addendum)
 Presenting symptoms occurring in the context of increased alcohol use suggests likely alcoholic gastritis and hepatitis. ACS and PE workup negative. CT findings demonstrated hepatic steatosis consistent with chronic alcohol use. LFT's and bili are downtrending.  - AM CMP - Continue pantoprazole  40 mg daily - Continue Tylenol  500 mg every 6 hours as needed for mild to moderate pain, low-dose for transaminitis - Holding rosuvastatin  until improving LFTs - CIWA protocol - Continue multivitamin, thiamine , and folic acid  -- BCX negative x 1 day; UCx in progress - Holding ceftriaxone  with pending urine culture and BCX x2

## 2023-11-25 NOTE — Inpatient Diabetes Management (Signed)
 Inpatient Diabetes Program Recommendations  AACE/ADA: New Consensus Statement on Inpatient Glycemic Control (2015)  Target Ranges:  Prepandial:   less than 140 mg/dL      Peak postprandial:   less than 180 mg/dL (1-2 hours)      Critically ill patients:  140 - 180 mg/dL   Lab Results  Component Value Date   GLUCAP 224 (H) 11/25/2023   HGBA1C 7.6 (H) 11/25/2023    Review of Glycemic Control  Latest Reference Range & Units 11/24/23 16:32 11/24/23 21:58 11/25/23 01:28 11/25/23 07:59  Glucose-Capillary 70 - 99 mg/dL 664 (H) 683 (H) 763 (H) 224 (H)  (H): Data is abnormally high Diabetes history: Type 2 DM Outpatient Diabetes medications: Metformin  500 mg BID Current orders for Inpatient glycemic control: Novolog  0-9 units TID  Inpatient Diabetes Program Recommendations:    Consider adding Lantus  8 units every day.   Thanks, Tinnie Minus, MSN, RNC-OB Diabetes Coordinator [phone] (8a-5p)

## 2023-11-26 ENCOUNTER — Other Ambulatory Visit (HOSPITAL_COMMUNITY): Payer: Self-pay

## 2023-11-26 DIAGNOSIS — K2970 Gastritis, unspecified, without bleeding: Secondary | ICD-10-CM | POA: Diagnosis not present

## 2023-11-26 LAB — COMPREHENSIVE METABOLIC PANEL WITH GFR
ALT: 126 U/L — ABNORMAL HIGH (ref 0–44)
AST: 93 U/L — ABNORMAL HIGH (ref 15–41)
Albumin: 2 g/dL — ABNORMAL LOW (ref 3.5–5.0)
Alkaline Phosphatase: 72 U/L (ref 38–126)
Anion gap: 6 (ref 5–15)
BUN: 6 mg/dL (ref 6–20)
CO2: 25 mmol/L (ref 22–32)
Calcium: 7.4 mg/dL — ABNORMAL LOW (ref 8.9–10.3)
Chloride: 103 mmol/L (ref 98–111)
Creatinine, Ser: 1.26 mg/dL — ABNORMAL HIGH (ref 0.44–1.00)
GFR, Estimated: 49 mL/min — ABNORMAL LOW (ref >60)
Glucose, Bld: 281 mg/dL — ABNORMAL HIGH (ref 70–99)
Potassium: 3.8 mmol/L (ref 3.5–5.1)
Sodium: 134 mmol/L — ABNORMAL LOW (ref 135–145)
Total Bilirubin: 1.4 mg/dL — ABNORMAL HIGH (ref 0.0–1.2)
Total Protein: 4.9 g/dL — ABNORMAL LOW (ref 6.5–8.1)

## 2023-11-26 LAB — GLUCOSE, CAPILLARY
Glucose-Capillary: 244 mg/dL — ABNORMAL HIGH (ref 70–99)
Glucose-Capillary: 257 mg/dL — ABNORMAL HIGH (ref 70–99)

## 2023-11-26 MED ORDER — ACETAMINOPHEN 500 MG PO TABS: 500.0000 mg | ORAL_TABLET | Freq: Four times a day (QID) | ORAL | Status: AC | PRN

## 2023-11-26 MED ORDER — ADULT MULTIVITAMIN W/MINERALS CH: 1.0000 | ORAL_TABLET | Freq: Every day | ORAL | Status: AC

## 2023-11-26 MED ORDER — VITAMIN B-1 100 MG PO TABS: 100.0000 mg | ORAL_TABLET | Freq: Every day | ORAL | Status: AC

## 2023-11-26 MED ORDER — INFLUENZA VIRUS VACC SPLIT PF (FLUZONE) 0.5 ML IM SUSY
0.5000 mL | PREFILLED_SYRINGE | INTRAMUSCULAR | Status: AC
  Administered 2023-11-26: 0.5 mL via INTRAMUSCULAR
  Filled 2023-11-26: qty 0.5

## 2023-11-26 MED ORDER — PANTOPRAZOLE SODIUM 40 MG PO TBEC
40.0000 mg | DELAYED_RELEASE_TABLET | Freq: Every day | ORAL | 0 refills | Status: DC
  Filled 2023-11-26: qty 30, 30d supply, fill #0

## 2023-11-26 MED ORDER — FOLIC ACID 1 MG PO TABS: 1.0000 mg | ORAL_TABLET | Freq: Every day | ORAL | Status: AC

## 2023-11-26 NOTE — Assessment & Plan Note (Signed)
 Stable; patient with symptom improvement. Presenting symptoms occurring in the context of increased alcohol use suggests likely alcoholic gastritis and hepatitis. LFT's, bili, LA are downtrending.  - AM CMP - Continue pantoprazole  40 mg daily - Continue Tylenol  500 mg every 6 hours as needed for mild to moderate pain, low-dose for transaminitis - Holding rosuvastatin  until improving LFTs - CIWA protocol - Continue multivitamin, thiamine , and folic acid  -- BCX negative x 1 day; UCx in progress - Holding ceftriaxone  with pending urine culture and BCX x2

## 2023-11-26 NOTE — Assessment & Plan Note (Signed)
 Lactic acid 3.1 on admission. Downtrending. Most likely secondary to alcohol use.

## 2023-11-26 NOTE — Evaluation (Signed)
 Occupational Therapy Evaluation Patient Details Name: Linda Livingston MRN: [id number] DOB: Dec 30, 1965 Today's Date: 11/26/2023   History of Present Illness   DEMITA TOBIA is a 58 year old female presenting with chest pain, most likely radiating from epigastrium secondary to alcoholic gastritis and hepatitis. Pertinent PMH/PSH includes T2DM and HLD.     Clinical Impressions Jacarra was evaluated s/p the above admission list. She is indep and lives with a friend at baseline. Upon evaluation, pt demonstrated indep ability to complete mobility and ADLs. Pt does not require further acute, or follow up OT services. Recommend discharge back to pt's environment with assist as needed. OT to sign off with appreciation of order, please re-consult if needed.        If plan is discharge home, recommend the following:   Assist for transportation     Functional Status Assessment   Patient has had a recent decline in their functional status and demonstrates the ability to make significant improvements in function in a reasonable and predictable amount of time.     Equipment Recommendations   None recommended by OT      Precautions/Restrictions   Precautions Precautions: Fall Recall of Precautions/Restrictions: Intact     Mobility Bed Mobility Overal bed mobility: Independent                  Transfers Overall transfer level: Independent      Balance Overall balance assessment: No apparent balance deficits (not formally assessed)           ADL either performed or assessed with clinical judgement   ADL Overall ADL's : Independent;At baseline               Vision Baseline Vision/History: 0 No visual deficits Vision Assessment?: No apparent visual deficits     Perception Perception: Within Functional Limits       Praxis Praxis: WFL       Pertinent Vitals/Pain Pain Assessment Pain Assessment: No/denies pain     Extremity/Trunk Assessment  Upper Extremity Assessment Upper Extremity Assessment: Overall WFL for tasks assessed   Lower Extremity Assessment Lower Extremity Assessment: Overall WFL for tasks assessed   Cervical / Trunk Assessment Cervical / Trunk Assessment: Normal   Communication Communication Communication: No apparent difficulties   Cognition Arousal: Alert Behavior During Therapy: WFL for tasks assessed/performed Cognition: No apparent impairments                               Following commands: Intact       Cueing  General Comments      VSS on RA    Home Living Family/patient expects to be discharged to:: Private residence Living Arrangements: Spouse/significant other Available Help at Discharge: Available PRN/intermittently;Friend(s) Type of Home: House Home Access: Stairs to enter Secretary/administrator of Steps: 2   Home Layout: One level     Bathroom Shower/Tub: Chief Strategy Officer: Standard     Home Equipment: None          Prior Functioning/Environment Prior Level of Function : Independent/Modified Independent             Mobility Comments: no AD ADLs Comments: does not drive or work    OT Problem List: Decreased safety awareness   OT Treatment/Interventions:        OT Goals(Current goals can be found in the care plan section)   Acute Rehab OT Goals Patient Stated Goal: home  OT Goal Formulation: With patient Time For Goal Achievement: 12/10/23 Potential to Achieve Goals: Good   AM-PAC OT 6 Clicks Daily Activity     Outcome Measure Help from another person eating meals?: None Help from another person taking care of personal grooming?: None Help from another person toileting, which includes using toliet, bedpan, or urinal?: None Help from another person bathing (including washing, rinsing, drying)?: None Help from another person to put on and taking off regular upper body clothing?: None Help from another person to put on  and taking off regular lower body clothing?: None 6 Click Score: 24   End of Session Nurse Communication: Mobility status  Activity Tolerance: Patient tolerated treatment well Patient left: in chair;with call bell/phone within reach  OT Visit Diagnosis: Unsteadiness on feet (R26.81);Other abnormalities of gait and mobility (R26.89);Muscle weakness (generalized) (M62.81)                Time: 9073-9054 OT Time Calculation (min): 19 min Charges:  OT General Charges $OT Visit: 1 Visit OT Evaluation $OT Eval Low Complexity: 1 Low  Lucie Kendall, OTR/L Acute Rehabilitation Services Office [phone] Secure Chat Communication Preferred   Lucie JONETTA Kendall 11/26/2023, 9:46 AM

## 2023-11-26 NOTE — Assessment & Plan Note (Signed)
 Hyperlipidemia - continue home ezetimibe ; holding rosuvastatin  due to transaminitis T2DM - holding metformin . SSI + CBGs. A1C 7.6

## 2023-11-26 NOTE — TOC CM/SW Note (Signed)
 Transition of Care [facility]) - Inpatient Brief Assessment   Patient Details  Name: Linda Livingston MRN: [id number] Date of Birth: Feb 09, 1966  Transition of Care [facility]) CM/SW Contact:    Waddell Barnie Rama, RN Phone Number: 11/26/2023, 9:56 AM   Clinical Narrative: From home alone, has PCP and insurance on file, states has no HH services in place at this time or DME at home.  States family member  (daughter) will transport them home at [employer] and family is support system, states gets medications from [facility] on [address].  Pta self ambulatory.   There are no ICM (Inpatient Case Management) needs identified  at this time.  Please place consult for  any ICM (Inpatient Case Management)  needs.     Transition of Care Asessment: Insurance and Status: Insurance coverage has been reviewed Patient has primary care physician: Yes Home environment has been reviewed: home alone Prior level of function:: indep Prior/Current Home Services: No current home services Social Drivers of Health Review: SDOH reviewed no interventions necessary Readmission risk has been reviewed: Yes Transition of care needs: no transition of care needs at this time

## 2023-11-26 NOTE — Progress Notes (Signed)
 PT Cancellation Note  Patient Details Name: Linda Livingston MRN: [id number] DOB: 1965/05/24   Cancelled Treatment:    Reason Eval/Treat Not Completed: PT screened, no needs identified, will sign off. Per OT, pt is at her baseline, mobilizing independently. No acute PT needs identified. Please re-consult if needed. Will sign off.  Linda Livingston, PT, DPT Acute Rehabilitation Services  Office: [phone]    Linda Livingston 11/26/2023, 10:16 AM

## 2023-11-27 ENCOUNTER — Ambulatory Visit: Payer: Self-pay | Admitting: Family Medicine

## 2023-11-27 LAB — URINE CULTURE: Culture: >=100000 — AB

## 2023-11-29 LAB — CULTURE, BLOOD (ROUTINE X 2)
Culture: NO GROWTH
Culture: NO GROWTH
Special Requests: ADEQUATE

## 2023-12-01 ENCOUNTER — Encounter: Payer: Self-pay | Admitting: Family Medicine

## 2023-12-01 ENCOUNTER — Ambulatory Visit (INDEPENDENT_AMBULATORY_CARE_PROVIDER_SITE_OTHER): Payer: Self-pay | Admitting: Family Medicine

## 2023-12-01 VITALS — BP 134/67 | HR 87 | Ht 65.0 in | Wt 179.1 lb

## 2023-12-01 DIAGNOSIS — R1013 Epigastric pain: Secondary | ICD-10-CM | POA: Diagnosis not present

## 2023-12-01 DIAGNOSIS — E119 Type 2 diabetes mellitus without complications: Secondary | ICD-10-CM | POA: Diagnosis not present

## 2023-12-01 DIAGNOSIS — F1090 Alcohol use, unspecified, uncomplicated: Secondary | ICD-10-CM

## 2023-12-01 DIAGNOSIS — R7989 Other specified abnormal findings of blood chemistry: Secondary | ICD-10-CM

## 2023-12-01 MED ORDER — PANTOPRAZOLE SODIUM 40 MG PO TBEC: 40.0000 mg | DELAYED_RELEASE_TABLET | Freq: Every day | ORAL | 2 refills | Status: AC

## 2023-12-01 MED ORDER — BLOOD GLUCOSE MONITORING SUPPL DEVI: 1.0000 | Freq: Three times a day (TID) | 5 refills | Status: DC

## 2023-12-01 MED ORDER — LANCETS MISC. MISC: 1.0000 | Freq: Three times a day (TID) | 5 refills | Status: AC

## 2023-12-01 NOTE — Assessment & Plan Note (Signed)
 Vitals stable today. Improving symptomatically since recent hospital discharge.  -Recheck CBC, CMP today -Discussed and placed referral to GI for further dyspepsia workup/management  -Continue daily protonix   -Hold statin & zetia  while awaiting repeat labs  -Encourage hydration and caution with changing positions  -Cardiology follow up planned soon

## 2023-12-01 NOTE — Assessment & Plan Note (Signed)
 Hospital discharge recommendation included possibility of GLP-1. Per discussion with patient today, patient reports her sister has had some thyroid  problems which required surgery, unsure if cancerous. Given this uncertainty, will defer GLP-1 for now. Last A1c 7.6 on 11/25/23. Continue current DM regimen: metformin  500 BID. Plan for diabetes FU in 3 months, with medication optimization as indicated.

## 2023-12-01 NOTE — Progress Notes (Signed)
    SUBJECTIVE:   CHIEF COMPLAINT / HPI:   Dyspepsia  AUD -Presenting for hospital fu today -Reports feeling better overall  -Been able to PO without issues, no N/V/D -Still has some epigastric discomfort intermittently throughout the day  -No hematuria, no pain with urination, no increased urgency/frequency -No bloody or black stools, no hemoptysis  -No alcohol use since hospitalization  -Feels dizzy when standing up fast  -No LOC, falls   PERTINENT  PMH / PSH: T2DM, AUD, HLD  OBJECTIVE:   BP 134/67   Pulse 87   Ht 5' 5 (1.651 m)   Wt 179 lb 2 oz (81.3 kg)   SpO2 100%   BMI 29.81 kg/m   General: Well-appearing. Resting comfortably in room. CV: Normal S1/S2. No extra heart sounds. Warm and well-perfused. Pulm: Breathing comfortably on room air. CTAB. No increased WOB. Abd: Normoactive BS. Soft, non-tender, non-distended. Skin:  Warm, dry. Psych: Pleasant and appropriate.    ASSESSMENT/PLAN:   Assessment & Plan Elevated LFTs Dyspepsia Alcohol use disorder Vitals stable today. Improving symptomatically since recent hospital discharge.  -Recheck CBC, CMP today -Discussed and placed referral to GI for further dyspepsia workup/management  -Continue daily protonix   -Hold statin & zetia  while awaiting repeat labs  -Encourage hydration and caution with changing positions  -Cardiology follow up planned soon Type 2 diabetes mellitus without complication, without long-term current use of insulin  [facility]) Hospital discharge recommendation included possibility of GLP-1. Per discussion with patient today, patient reports her sister has had some thyroid  problems which required surgery, unsure if cancerous. Given this uncertainty, will defer GLP-1 for now. Last A1c 7.6 on 11/25/23. Continue current DM regimen: metformin  500 BID. Plan for diabetes FU in 3 months, with medication optimization as indicated.    RTC in 3 months or sooner as needed.   Damien Cassis, MD [facility]

## 2023-12-01 NOTE — Patient Instructions (Addendum)
 Thank you for visiting clinic today and allowing us  to participate in your care!  We are so glad you are feeling better! Please continue taking the Pantoprazole  every day. Please hold off on taking your cholesterol medications (stain, zetia ) until your labwork comes back.   Someone should contact you over the next few weeks about scheduling an appointment to see the GI specialists.   Please schedule an appointment in 3 months for follow up or sooner as needed.   Reach out any time with any questions or concerns you may have - we are here for you!  Damien Cassis, MD [facility] [phone]

## 2023-12-02 LAB — COMPREHENSIVE METABOLIC PANEL WITH GFR
ALT: 45 IU/L — ABNORMAL HIGH (ref 0–32)
AST: 31 IU/L (ref 0–40)
Albumin: 3.2 g/dL — ABNORMAL LOW (ref 3.8–4.9)
Alkaline Phosphatase: 66 IU/L (ref 49–135)
BUN/Creatinine Ratio: 8 — ABNORMAL LOW (ref 9–23)
BUN: 7 mg/dL (ref 6–24)
Bilirubin Total: 0.4 mg/dL (ref 0.0–1.2)
CO2: 24 mmol/L (ref 20–29)
Calcium: 8.8 mg/dL (ref 8.7–10.2)
Chloride: 104 mmol/L (ref 96–106)
Creatinine, Ser: 0.91 mg/dL (ref 0.57–1.00)
Globulin, Total: 2.6 g/dL (ref 1.5–4.5)
Glucose: 134 mg/dL — ABNORMAL HIGH (ref 70–99)
Potassium: 4.2 mmol/L (ref 3.5–5.2)
Sodium: 139 mmol/L (ref 134–144)
Total Protein: 5.8 g/dL — ABNORMAL LOW (ref 6.0–8.5)
eGFR: 73 mL/min/1.73 (ref >59)

## 2023-12-02 LAB — CBC
Hematocrit: 34.4 % (ref 34.0–46.6)
Hemoglobin: 11 g/dL — ABNORMAL LOW (ref 11.1–15.9)
MCH: 33.4 pg — ABNORMAL HIGH (ref 26.6–33.0)
MCHC: 32 g/dL (ref 31.5–35.7)
MCV: 105 fL — ABNORMAL HIGH (ref 79–97)
Platelets: 183 x10E3/uL (ref 150–450)
RBC: 3.29 x10E6/uL — ABNORMAL LOW (ref 3.77–5.28)
RDW: 14.4 % (ref 11.7–15.4)
WBC: 5 x10E3/uL (ref 3.4–10.8)

## 2023-12-03 ENCOUNTER — Ambulatory Visit: Payer: Self-pay | Admitting: Family Medicine

## 2023-12-10 ENCOUNTER — Encounter: Payer: Self-pay | Admitting: Cardiology

## 2023-12-10 ENCOUNTER — Ambulatory Visit: Attending: Cardiology | Admitting: Cardiology

## 2023-12-10 ENCOUNTER — Other Ambulatory Visit (HOSPITAL_COMMUNITY): Payer: Self-pay

## 2023-12-10 VITALS — BP 132/76 | HR 89 | Ht 66.0 in | Wt 185.6 lb

## 2023-12-10 DIAGNOSIS — E1165 Type 2 diabetes mellitus with hyperglycemia: Secondary | ICD-10-CM

## 2023-12-10 DIAGNOSIS — E782 Mixed hyperlipidemia: Secondary | ICD-10-CM

## 2023-12-10 DIAGNOSIS — K219 Gastro-esophageal reflux disease without esophagitis: Secondary | ICD-10-CM

## 2023-12-10 DIAGNOSIS — R079 Chest pain, unspecified: Secondary | ICD-10-CM

## 2023-12-10 MED ORDER — LOSARTAN POTASSIUM 25 MG PO TABS
25.0000 mg | ORAL_TABLET | Freq: Every day | ORAL | 0 refills | Status: AC
  Filled 2023-12-10: qty 90, 90d supply, fill #0

## 2023-12-10 MED ORDER — EZETIMIBE 10 MG PO TABS: 10.0000 mg | ORAL_TABLET | Freq: Every day | ORAL | Status: DC

## 2023-12-10 NOTE — Progress Notes (Signed)
 Cardiology Office Note:  .   Date:  12/10/2023  ID:  KAMYAH WILHELMSEN, DOB Feb 07, 1966, MRN [id number] PCP: Diona Perkins, MD  [facility] Providers Cardiologist:  None   History of Present Illness: .   Linda Livingston is a 58 y.o. African-American female patient with diabetes mellitus, hypercholesterolemia, tobacco use disorder, alcohol use disorder was admitted to [facility] on 11/24/2022 and discharged 2 days later for chest pain.  Workup for PE and ACS was unremarkable, patient chest pain felt to be secondary to alcoholic gastritis, started on PPI and discharged home with outpatient follow-up, cardiology referrals made due to cardiovascular risk factors.   Since hospital discharge, patient has abstained from alcohol.  She has also quit smoking cigarettes about a month ago.  States that she is now planning to take good care of her health.  She has returned to full activity without any limitations.  Cardiac Studies relevent.    CT angio chest 11/24/2023: 1. No acute intrathoracic pathology. No CT evidence of pulmonary artery embolus. 2. Fatty liver.    Discussed the use of AI scribe software for clinical note transcription with the patient, who gave verbal consent to proceed.  History of Present Illness Linda Livingston is a 58 year old female who presents for follow-up after a recent hospitalization for chest pain.  She was hospitalized on November 24, 2022, for two days due to chest pain. She currently has no chest pain and has resumed her usual activities. She has stopped consuming alcohol, which was noted during her hospital stay.  She manages diabetes with metformin  and dietary modifications, avoiding high-starch foods. She also manages hyperlipidemia by limiting fried foods and chips.  She quit smoking a month ago and engages in regular exercise by walking around her neighborhood block twice. She lives with her daughter and maintains her household independently.  Labs    Lab Results  Component Value Date   CHOL 163 11/25/2023   HDL 34 (L) 11/25/2023   LDLCALC 61 11/25/2023   TRIG 342 (H) 11/25/2023   CHOLHDL 4.8 11/25/2023   No results found for: LIPOA  Recent Labs    11/24/23 1247 11/24/23 1338 11/25/23 0453 11/26/23 0248 12/01/23 1709  NA 131*   < > 133* 134* 139  K 4.4   < > 3.9 3.8 4.2  CL 95*   < > 103 103 104  CO2 24  --  21* 25 24  GLUCOSE 378*   < > 226* 281* 134*  BUN 9   < > 8 6 7   CREATININE 0.99   < > 1.06* 1.26* 0.91  CALCIUM  8.2*  --  7.4* 7.4* 8.8  GFRNONAA >60  --  >60 49*  --    < > = values in this interval not displayed.    Lab Results  Component Value Date   ALT 45 (H) 12/01/2023   AST 31 12/01/2023   ALKPHOS 66 12/01/2023   BILITOT 0.4 12/01/2023      Latest Ref Rng & Units 12/01/2023    5:09 PM 11/25/2023    4:53 AM 11/24/2023    1:38 PM  CBC  WBC 3.4 - 10.8 x10E3/uL 5.0  5.6    Hemoglobin 11.1 - 15.9 g/dL 88.9  88.8  85.3    84.9   Hematocrit 34.0 - 46.6 % 34.4  31.1  43.0    44.0   Platelets 150 - 450 x10E3/uL 183  137     Lab Results  Component Value Date   HGBA1C 7.6 (H) 11/25/2023    Lab Results  Component Value Date   TSH 1.605 11/24/2023     ROS  Review of Systems  Cardiovascular:  Negative for chest pain, dyspnea on exertion and leg swelling.   Physical Exam:   VS:  BP 132/76 (BP Location: Right Arm, Patient Position: Sitting)   Pulse 89   Ht 5' 6 (1.676 m)   Wt 185 lb 9.6 oz (84.2 kg)   BMI 29.96 kg/m    Wt Readings from Last 3 Encounters:  12/10/23 185 lb 9.6 oz (84.2 kg)  12/01/23 179 lb 2 oz (81.3 kg)  11/24/23 174 lb (78.9 kg)    BP Readings from Last 3 Encounters:  12/10/23 132/76  12/01/23 134/67  11/26/23 129/67   Physical Exam Neck:     Vascular: No carotid bruit or JVD.  Cardiovascular:     Rate and Rhythm: Normal rate and regular rhythm.     Pulses: Intact distal pulses.     Heart sounds: Normal heart sounds. No murmur heard.    No gallop.  Pulmonary:      Effort: Pulmonary effort is normal.     Breath sounds: Normal breath sounds.  Abdominal:     General: Bowel sounds are normal.     Palpations: Abdomen is soft.  Musculoskeletal:     Right lower leg: No edema.     Left lower leg: No edema.    EKG:    EKG Interpretation Date/Time:  Thursday December 10 2023 09:35:46 EDT Ventricular Rate:  89 PR Interval:  152 QRS Duration:  80 QT Interval:  360 QTC Calculation: 438 R Axis:   7  Text Interpretation: EKG 12/10/2023: Normal sinus rhythm at rate of 89 bpm, normal EKG.  Compared to 11/24/2023, sinus tachycardia not present. Confirmed by Furious Chiarelli, Jagadeesh [id number]) on 12/10/2023 9:36:56 AM   EKG 11/24/2023: Sinus tachycardia at rate of 115 bpm otherwise normal EKG.  ASSESSMENT AND PLAN: .      ICD-10-CM   1. Chest pain due to GERD  K21.9 EKG 12-Lead   R07.9     2. Mixed hyperlipidemia  E78.2 ezetimibe  (ZETIA ) 10 MG tablet    3. Type 2 diabetes mellitus with hyperglycemia, without long-term current use of insulin  (HCC)  E11.65 losartan  (COZAAR ) 25 MG tablet    CMP14+EGFR     Assessment & Plan  Chest pain - Symptoms have improved with PPI, do not suggest angina pectoris.  No further evaluation is indicated.  Continue primary prevention.  Alcoholic hepatitis with fatty liver infiltration Alcoholic hepatitis with fatty liver infiltration with elevated liver enzymes. She has ceased alcohol consumption, which is crucial for liver health improvement. - Continue abstaining from alcohol to improve liver health.  Uncontrolled type 2 diabetes mellitus Diabetes is currently uncontrolled, exacerbated by previous alcohol consumption. Emphasis on dietary control to manage blood glucose levels, particularly the impact of starches and sugars. - Start losartan  25 mg once daily to protect kidneys due to diabetes. - Perform blood work in two weeks at [facility] to monitor diabetes control.  Mixed hyperlipidemia Mixed hyperlipidemia requires dietary  management, particularly reducing intake of starches and sugars. Resuming Zetia  is advised to manage cholesterol levels effectively. - Resume Zetia  for cholesterol management. - LFTs are only minimally abnormal.  Gastroesophageal reflux disease Chest pain previously evaluated and attributed to gastroesophageal reflux disease. No current chest pain reported. No further cardiac evaluation needed as symptoms have resolved with lifestyle modifications.  Follow up: As needed  Signed,  Gordy Bergamo, MD, [facility] 12/10/2023, 9:58 AM [facility] [address] [place], [place] [address] Phone: [phone]. Fax:  [phone]

## 2023-12-10 NOTE — Patient Instructions (Signed)
 Medication Instructions:  Your physician recommends that you continue on your current medications as directed. Please refer to the Current Medication list given to you today.  *If you need a refill on your cardiac medications before your next appointment, please call your pharmacy*  Lab Work: Cmp in 2 weeks If you have labs (blood work) drawn today and your tests are completely normal, you will receive your results only by: MyChart Message (if you have MyChart) OR A paper copy in the mail If you have any lab test that is abnormal or we need to change your treatment, we will call you to review the results.  Testing/Procedures: none  Follow-Up: At [facility], you and your health needs are our priority.  As part of our continuing mission to provide you with exceptional heart care, our providers are all part of one team.  This team includes your primary Cardiologist (physician) and Advanced Practice Providers or APPs (Physician Assistants and Nurse Practitioners) who all work together to provide you with the care you need, when you need it.  Your next appointment:   As needed  Provider:   Dr. Ladona    We recommend signing up for the patient portal called MyChart.  Sign up information is provided on this After Visit Summary.  MyChart is used to connect with patients for Virtual Visits (Telemedicine).  Patients are able to view lab/test results, encounter notes, upcoming appointments, etc.  Non-urgent messages can be sent to your provider as well.   To learn more about what you can do with MyChart, go to [email or web address].   Other Instructions none

## 2023-12-23 DIAGNOSIS — E1165 Type 2 diabetes mellitus with hyperglycemia: Secondary | ICD-10-CM | POA: Diagnosis not present

## 2023-12-24 ENCOUNTER — Ambulatory Visit: Payer: Self-pay

## 2023-12-24 LAB — CMP14+EGFR
ALT: 17 IU/L (ref 0–32)
AST: 25 IU/L (ref 0–40)
Albumin: 3.8 g/dL (ref 3.8–4.9)
Alkaline Phosphatase: 69 IU/L (ref 49–135)
BUN/Creatinine Ratio: 15 (ref 9–23)
BUN: 12 mg/dL (ref 6–24)
Bilirubin Total: 0.3 mg/dL (ref 0.0–1.2)
CO2: 22 mmol/L (ref 20–29)
Calcium: 8.9 mg/dL (ref 8.7–10.2)
Chloride: 102 mmol/L (ref 96–106)
Creatinine, Ser: 0.79 mg/dL (ref 0.57–1.00)
Globulin, Total: 2.7 g/dL (ref 1.5–4.5)
Glucose: 119 mg/dL — ABNORMAL HIGH (ref 70–99)
Potassium: 4 mmol/L (ref 3.5–5.2)
Sodium: 137 mmol/L (ref 134–144)
Total Protein: 6.5 g/dL (ref 6.0–8.5)
eGFR: 87 mL/min/1.73 (ref >59)

## 2023-12-24 NOTE — Progress Notes (Signed)
 Normal CMP with mild elevation in blood sugar

## 2023-12-30 ENCOUNTER — Encounter: Payer: Self-pay | Admitting: Podiatry

## 2023-12-30 ENCOUNTER — Ambulatory Visit: Admitting: Podiatry

## 2023-12-30 DIAGNOSIS — E1142 Type 2 diabetes mellitus with diabetic polyneuropathy: Secondary | ICD-10-CM

## 2023-12-30 DIAGNOSIS — Q828 Other specified congenital malformations of skin: Secondary | ICD-10-CM

## 2023-12-31 ENCOUNTER — Telehealth: Payer: Self-pay | Admitting: Pharmacist

## 2023-12-31 ENCOUNTER — Other Ambulatory Visit: Payer: Self-pay | Admitting: Family Medicine

## 2023-12-31 MED ORDER — BLOOD GLUCOSE TEST STRIPS 333 VI STRP: 1.0000 | ORAL_STRIP | Freq: Every day | 11 refills | Status: AC

## 2023-12-31 MED ORDER — BLOOD GLUCOSE MONITORING SUPPL DEVI: 1.0000 | Freq: Three times a day (TID) | 5 refills | Status: AC

## 2023-12-31 NOTE — Addendum Note (Signed)
 Addended by: Channie Bostick G on: 12/31/2023 04:58 PM   Modules accepted: Orders

## 2023-12-31 NOTE — Telephone Encounter (Signed)
 Patient contacted for Diabetes Follow-up QI measures 2025  Scheduled for PCP visit 11/10 - needs UACR  Total time with patient call and documentation of interaction: 11 minutes.

## 2023-12-31 NOTE — Telephone Encounter (Signed)
 Chart reviewed. Rx refilled.

## 2024-01-03 NOTE — Progress Notes (Signed)
  Subjective:  Patient ID: MARCUS GROLL, female    DOB: [birth year],  MRN: [id number]  ESSYNCE MUNSCH presents to clinic today for at risk foot care with history of diabetic neuropathy and painful porokeratotic lesions of both feet. Pain prevent(s) comfortable ambulation. Aggravating factor is weightbearing with and without shoegear.  Chief Complaint  Patient presents with   Diabetes    DFC NIDDM A1C 7.6. Toenail trim. LOIV with PCP 12/01/23.   New problem(s): None.   PCP is Diona Perkins, MD.  No Known Allergies  Review of Systems: Negative except as noted in the HPI.  Objective: No changes noted in today's physical examination. There were no vitals filed for this visit. ROYAL BEIRNE is a pleasant 58 y.o. female in NAD. AAO x 3.   Vascular Examination: Capillary refill time immediate b/l. Palpable pedal pulses. Pedal hair present b/l. Pedal edema absent. No pain with calf compression b/l. Skin temperature gradient WNL b/l. No cyanosis or clubbing b/l. No ischemia or gangrene noted b/l.   Neurological Examination: Sensation grossly intact b/l with 10 gram monofilament. Vibratory sensation intact b/l. Pt has subjective symptoms of neuropathy.  Dermatological Examination: Pedal skin with normal turgor, texture and tone b/l.  No open wounds. No interdigital macerations.   Toenails recently debrided. Porokeratotic lesion(s) medial IPJ of left great toe, medial IPJ of right great toe, submet head 2 right foot, submet head 5 right foot, and 1st metatarsal head b/l lower extremities. No erythema, no edema, no drainage, no fluctuance.  Musculoskeletal Examination: Muscle strength 5/5 to all lower extremity muscle groups bilaterally. Pes planus deformity noted bilateral LE. Patient ambulates independent of any assistive aids.  Radiographs: None  Last A1c:      Latest Ref Rng & Units 11/25/2023    4:53 AM  Hemoglobin A1C  Hemoglobin-A1c 4.8 - 5.6 % 7.6     Assessment/Plan: 1.  Porokeratosis   2. Diabetic peripheral neuropathy associated with type 2 diabetes mellitus (HCC)     -Consent given for treatment as described below: -Examined patient. -Continue foot and shoe inspections daily. Monitor blood glucose per PCP/Endocrinologist's recommendations. -Patient to continue soft, supportive shoe gear daily. -Porokeratotic lesion(s) medial IPJ of left great toe, medial IPJ of right great toe, submet head 2 right foot, submet head 5 left foot, and 1st metatarsal head b/l lower extremities pared and enucleated with sterile currette. Light bleeding submet head 2 right foot addressed with Lumicain Hemostatic Solution. TAO and band-aid applied. She is to apply to Total number of lesions debrided=6.  Return in about 3 months (around 03/31/2024).  Delon LITTIE Merlin, DPM      [place] LOCATION: [address] [place] [address]                   Office [phone]   [facility] LOCATION: [address] [place], [place] [address] Office [phone]

## 2024-01-07 ENCOUNTER — Ambulatory Visit

## 2024-01-07 VITALS — Ht 66.0 in | Wt 179.0 lb

## 2024-01-07 DIAGNOSIS — Z0000 Encounter for general adult medical examination without abnormal findings: Secondary | ICD-10-CM | POA: Diagnosis not present

## 2024-01-07 NOTE — Progress Notes (Signed)
 Because this visit was a virtual/telehealth visit,  certain criteria was not obtained, such a blood pressure, CBG if applicable, and timed get up and go. Any medications not marked as taking were not mentioned during the medication reconciliation part of the visit. Any vitals not documented were not able to be obtained due to this being a telehealth visit or patient was unable to self-report a recent blood pressure reading due to a lack of equipment at home via telehealth. Vitals that have been documented are verbally provided by the patient.   Subjective:   Linda Livingston is a 58 y.o. who presents for a Medicare Wellness preventive visit.  As a reminder, Annual Wellness Visits don't include a physical exam, and some assessments may be limited, especially if this visit is performed virtually. We may recommend an in-person follow-up visit with your provider if needed.  Visit Complete: Virtual I connected with  [name] on 01/07/24 by a audio enabled telemedicine application and verified that I am speaking with the correct person using two identifiers.  Patient Location: Home  Provider Location: Home Office  I discussed the limitations of evaluation and management by telemedicine. The patient expressed understanding and agreed to proceed.  Vital Signs: Because this visit was a virtual/telehealth visit, some criteria may be missing or patient reported. Any vitals not documented were not able to be obtained and vitals that have been documented are patient reported.  VideoDeclined- This patient declined Librarian, academic. Therefore the visit was completed with audio only.  Persons Participating in Visit: Patient.  AWV Questionnaire: No: Patient Medicare AWV questionnaire was not completed prior to this visit.  Cardiac Risk Factors include: advanced age (>69men, >51 women);diabetes mellitus;dyslipidemia     Objective:    Today's Vitals   01/07/24  1032  Weight: 179 lb (81.2 kg)  Height: 5' 6 (1.676 m)  PainSc: 0-No pain   Body mass index is 28.89 kg/m.     01/07/2024   10:35 AM 12/01/2023    2:51 PM 11/24/2023    1:02 PM 12/30/2022   11:08 AM 12/16/2022    9:22 AM 03/27/2021    9:00 AM 08/01/2019    3:30 PM  Advanced Directives  Does Patient Have a Medical Advance Directive? No No No No No No No  Would patient like information on creating a medical advance directive? No - Patient declined No - Patient declined No - Patient declined  No - Patient declined No - Patient declined No - Patient declined    Current Medications (verified) Outpatient Encounter Medications as of 01/07/2024  Medication Sig   acetaminophen  (TYLENOL ) 500 MG tablet Take 1 tablet (500 mg total) by mouth every 6 (six) hours as needed for moderate pain (pain score 4-6) or mild pain (pain score 1-3).   Blood Glucose Monitoring Suppl DEVI 1 each by Does not apply route in the morning, at noon, and at bedtime. May substitute to any manufacturer covered by patient's insurance.   ezetimibe  (ZETIA ) 10 MG tablet Take 1 tablet (10 mg total) by mouth daily.   folic acid  (FOLVITE ) 1 MG tablet Take 1 tablet (1 mg total) by mouth daily.   Glucose Blood (BLOOD GLUCOSE TEST STRIPS 333) STRP 1 Container by Other route daily.   losartan  (COZAAR ) 25 MG tablet Take 1 tablet (25 mg total) by mouth daily.   metFORMIN  (GLUCOPHAGE ) 500 MG tablet TAKE 1 TABLET(500 MG) BY MOUTH TWICE DAILY WITH A MEAL   Multiple Vitamin (  MULTIVITAMIN WITH MINERALS) TABS tablet Take 1 tablet by mouth daily.   pantoprazole  (PROTONIX ) 40 MG tablet Take 1 tablet (40 mg total) by mouth daily.   rosuvastatin  (CRESTOR ) 20 MG tablet TAKE 1 TABLET(20 MG) BY MOUTH DAILY   thiamine  (VITAMIN B-1) 100 MG tablet Take 1 tablet (100 mg total) by mouth daily.   No facility-administered encounter medications on file as of 01/07/2024.    Allergies (verified) Patient has no known allergies.   History: Past Medical  History:  Diagnosis Date   Diabetes mellitus without complication (HCC)    Hyperlipidemia    History reviewed. No pertinent surgical history. Family History  Problem Relation Age of Onset   Breast cancer Neg Hx    Social History   Socioeconomic History   Marital status: Married    Spouse name: Not on file   Number of children: Not on file   Years of education: Not on file   Highest education level: Not on file  Occupational History   Not on file  Tobacco Use   Smoking status: Former    Current packs/day: 0.00    Types: Cigarettes    Quit date: 2025    Years since quitting: 0.8    Passive exposure: Current   Smokeless tobacco: Never   Tobacco comments:    Patient quit smoking 10/2023.  Substance and Sexual Activity   Alcohol use: Not Currently    Comment: occas   Drug use: Never   Sexual activity: Not Currently  Other Topics Concern   Not on file  Social History Narrative   Not on file   Social Drivers of Health   Financial Resource Strain: Low Risk  (01/07/2024)   Overall Financial Resource Strain (CARDIA)    Difficulty of Paying Living Expenses: Not hard at all  Food Insecurity: No Food Insecurity (01/07/2024)   Hunger Vital Sign    Worried About Running Out of Food in the Last Year: Never true    Ran Out of Food in the Last Year: Never true  Transportation Needs: No Transportation Needs (01/07/2024)   PRAPARE - Administrator, Civil Service (Medical): No    Lack of Transportation (Non-Medical): No  Physical Activity: Sufficiently Active (01/07/2024)   Exercise Vital Sign    Days of Exercise per Week: 5 days    Minutes of Exercise per Session: 30 min  Stress: No Stress Concern Present (01/07/2024)   [employer] of Occupational Health - Occupational Stress Questionnaire    Feeling of Stress: Not at all  Social Connections: Moderately Integrated (01/07/2024)   Social Connection and Isolation Panel    Frequency of Communication with Friends  and Family: More than three times a week    Frequency of Social Gatherings with Friends and Family: Twice a week    Attends Religious Services: More than 4 times per year    Active Member of [employer] or Organizations: No    Attends Banker Meetings: Never    Marital Status: Married    Tobacco Counseling Counseling given: No Tobacco comments: Patient quit smoking 10/2023.    Clinical Intake:  Pre-visit preparation completed: Yes  Pain : No/denies pain Pain Score: 0-No pain     BMI - recorded: 28.89 Nutritional Status: BMI 25 -29 Overweight Nutritional Risks: None Diabetes: Yes CBG done?: No Did pt. bring in CBG monitor from home?: No  Lab Results  Component Value Date   HGBA1C 7.6 (H) 11/25/2023   HGBA1C 6.5 (A) 12/16/2022  HGBA1C 6.0 03/27/2021     How often do you need to have someone help you when you read instructions, pamphlets, or other written materials from your doctor or pharmacy?: 1 - Never What is the last grade level you completed in school?: HSG  Interpreter Needed?: No  Information entered by :: Kiarra Kidd N. Kailynn Satterly, LPN.   Activities of Daily Living     01/07/2024   10:37 AM 11/25/2023   11:35 PM  In your present state of health, do you have any difficulty performing the following activities:  Hearing? 0 0  Vision? 0 0  Difficulty concentrating or making decisions? 0 0  Comment BSE: PUZZLES, GAMES ON PHONE   Walking or climbing stairs? 0   Dressing or bathing? 0   Doing errands, shopping? 0   Preparing Food and eating ? N   Using the Toilet? N   In the past six months, have you accidently leaked urine? N   Do you have problems with loss of bowel control? N   Managing your Medications? N   Managing your Finances? N   Housekeeping or managing your Housekeeping? N     Patient Care Team: Diona Perkins, MD as PCP - General  I have updated your Care Teams any recent Medical Services you may have received from other providers in the  past year.     Assessment:   This is a routine wellness examination for Linda Livingston.  Hearing/Vision screen Hearing Screening - Comments:: Denies hearing difficulties.  Vision Screening - Comments:: Wears rx glasses - up to date with routine eye exams with [facility]    Goals Addressed             This Visit's Progress    01/07/2024: To maintain my health, stay independent and active.         Depression Screen     01/07/2024   10:38 AM 12/30/2022   11:13 AM 12/16/2022    9:27 AM 03/27/2021    9:00 AM 08/01/2019    3:30 PM 03/05/2018    1:34 PM 12/02/2017   10:06 AM  PHQ 2/9 Scores  PHQ - 2 Score 0 0 3 3 0 0 0  PHQ- 9 Score 0 0 3 3       Fall Risk     01/07/2024   10:35 AM 12/30/2022   11:07 AM 08/01/2019    3:30 PM 03/05/2018    1:34 PM 12/02/2017   10:06 AM  Fall Risk   Falls in the past year? 0 0 0 0  No   Number falls in past yr: 0 0 0    Injury with Fall? 0 0 0    Risk for fall due to : No Fall Risks      Follow up Falls evaluation completed Falls evaluation completed;Education provided;Falls prevention discussed        Data saved with a previous flowsheet row definition    MEDICARE RISK AT HOME:  Medicare Risk at Home Any stairs in or around the home?: Yes (FRONT ENTRY) If so, are there any without handrails?: No Home free of loose throw rugs in walkways, pet beds, electrical cords, etc?: Yes Adequate lighting in your home to reduce risk of falls?: Yes Life alert?: No Use of a cane, walker or w/c?: No Grab bars in the bathroom?: No Shower chair or bench in shower?: No Elevated toilet seat or a handicapped toilet?: No  TIMED UP AND GO:  Was the test performed?  No  Cognitive Function: Declined/Normal: No cognitive concerns noted by patient or family. Patient alert, oriented, able to answer questions appropriately and recall recent events. No signs of memory loss or confusion.    01/07/2024   10:38 AM  MMSE - Mini Mental State Exam  Not  completed: Unable to complete        01/07/2024   10:49 AM 12/30/2022   11:10 AM  6CIT Screen  What Year? 0 points 0 points  What month? 0 points 0 points  What time? 0 points 0 points  Count back from 20 0 points 0 points  Months in reverse 0 points 0 points  Repeat phrase 0 points 0 points  Total Score 0 points 0 points    Immunizations Immunization History  Administered Date(s) Administered   Influenza, Seasonal, Injecte, Preservative Fre 12/16/2022, 11/26/2023   Influenza,inj,Quad PF,6+ Mos 12/02/2017, 03/27/2021   PFIZER Comirnaty(Gray Top)Covid-19 Tri-Sucrose Vaccine 06/17/2019, 07/08/2019, 02/01/2020   PNEUMOCOCCAL CONJUGATE-20 03/27/2021   Pfizer Covid-19 Vaccine Bivalent Booster 65yrs & up 03/27/2021   Pneumococcal Polysaccharide-23 12/02/2017    Screening Tests Health Maintenance  Topic Date Due   DTaP/Tdap/Td (1 - Tdap) Never done   Hepatitis B Vaccines 19-59 Average Risk (1 of 3 - 19+ 3-dose series) Never done   Cervical Cancer Screening (HPV/Pap Cotest)  Never done   Colonoscopy  Never done   Zoster Vaccines- Shingrix  (1 of 2) Never done   OPHTHALMOLOGY EXAM  08/05/2023   COVID-19 Vaccine (5 - 2025-26 season) 11/16/2023   Diabetic kidney evaluation - Urine ACR  12/16/2023   HEMOGLOBIN A1C  05/24/2024   FOOT EXAM  09/21/2024   Mammogram  12/16/2024   Diabetic kidney evaluation - eGFR measurement  12/22/2024   Medicare Annual Wellness (AWV)  01/06/2025   Pneumococcal Vaccine: 50+ Years  Completed   Influenza Vaccine  Completed   Hepatitis C Screening  Completed   HIV Screening  Completed   HPV VACCINES  Aged Out   Meningococcal B Vaccine  Aged Out    Health Maintenance Items Addressed: Yes Vaccines Due: Covid, Dtap, Labs Due Urine ACR, Procedures due: Eye Exam, Pap Smear, Colonoscopy.  Additional Screening:  Vision Screening: Recommended annual ophthalmology exams for early detection of glaucoma and other disorders of the eye. Is the patient up to  date with their annual eye exam?  No  Who is the provider or what is the name of the office in which the patient attends annual eye exams? [facility]  Dental Screening: Recommended annual dental exams for proper oral hygiene  Community Resource Referral / Chronic Care Management: CRR required this visit?  No   CCM required this visit?  No   Plan:    I have personally reviewed and noted the following in the patient's chart:   Medical and social history Use of alcohol, tobacco or illicit drugs  Current medications and supplements including opioid prescriptions. Patient is not currently taking opioid prescriptions. Functional ability and status Nutritional status Physical activity Advanced directives List of other physicians Hospitalizations, surgeries, and ER visits in previous 12 months Vitals Screenings to include cognitive, depression, and falls Referrals and appointments  In addition, I have reviewed and discussed with patient certain preventive protocols, quality metrics, and best practice recommendations. A written personalized care plan for preventive services as well as general preventive health recommendations were provided to patient.   Roz LOISE Fuller, LPN   [id number]/[id number]   After Visit Summary: (MyChart) Due to this being a  telephonic visit, the after visit summary with patients personalized plan was offered to patient via MyChart   Notes: Vaccines Due: Covid, Dtap, Labs Due Urine ACR, Procedures due: Eye Exam, Pap Smear, Colonoscopy.

## 2024-01-07 NOTE — Patient Instructions (Signed)
 Ms. Sharpless,  Thank you for taking the time for your Medicare Wellness Visit. I appreciate your continued commitment to your health goals. Please review the care plan we discussed, and feel free to reach out if I can assist you further.  Medicare recommends these wellness visits once per year to help you and your care team stay ahead of potential health issues. These visits are designed to focus on prevention, allowing your provider to concentrate on managing your acute and chronic conditions during your regular appointments.  Please note that Annual Wellness Visits do not include a physical exam. Some assessments may be limited, especially if the visit was conducted virtually. If needed, we may recommend a separate in-person follow-up with your provider.  Ongoing Care Seeing your primary care provider every 3 to 6 months helps us  monitor your health and provide consistent, personalized care.   Referrals If a referral was made during today's visit and you haven't received any updates within two weeks, please contact the referred provider directly to check on the status.  Recommended Screenings:  Health Maintenance  Topic Date Due   DTaP/Tdap/Td vaccine (1 - Tdap) Never done   Hepatitis B Vaccine (1 of 3 - 19+ 3-dose series) Never done   Pap with HPV screening  Never done   Colon Cancer Screening  Never done   Zoster (Shingles) Vaccine (1 of 2) Never done   Eye exam for diabetics  08/05/2023   COVID-19 Vaccine (5 - 2025-26 season) 11/16/2023   Yearly kidney health urinalysis for diabetes  12/16/2023   Hemoglobin A1C  05/24/2024   Complete foot exam   09/21/2024   Breast Cancer Screening  12/16/2024   Yearly kidney function blood test for diabetes  12/22/2024   Medicare Annual Wellness Visit  01/06/2025   Pneumococcal Vaccine for age over 20  Completed   Flu Shot  Completed   Hepatitis C Screening  Completed   HIV Screening  Completed   HPV Vaccine  Aged Out   Meningitis B Vaccine   Aged Out       01/07/2024   10:35 AM  Advanced Directives  Does Patient Have a Medical Advance Directive? No  Would patient like information on creating a medical advance directive? No - Patient declined   Advance Care Planning is important because it: Ensures you receive medical care that aligns with your values, goals, and preferences. Provides guidance to your family and loved ones, reducing the emotional burden of decision-making during critical moments.  Vision: Annual vision screenings are recommended for early detection of glaucoma, cataracts, and diabetic retinopathy. These exams can also reveal signs of chronic conditions such as diabetes and high blood pressure.  Dental: Annual dental screenings help detect early signs of oral cancer, gum disease, and other conditions linked to overall health, including heart disease and diabetes.  Please see the attached documents for additional preventive care recommendations.

## 2024-01-25 ENCOUNTER — Ambulatory Visit (INDEPENDENT_AMBULATORY_CARE_PROVIDER_SITE_OTHER): Admitting: Family Medicine

## 2024-01-25 ENCOUNTER — Encounter: Payer: Self-pay | Admitting: Family Medicine

## 2024-01-25 VITALS — BP 132/69 | HR 86 | Wt 181.0 lb

## 2024-01-25 DIAGNOSIS — E782 Mixed hyperlipidemia: Secondary | ICD-10-CM

## 2024-01-25 DIAGNOSIS — E119 Type 2 diabetes mellitus without complications: Secondary | ICD-10-CM

## 2024-01-25 DIAGNOSIS — M25462 Effusion, left knee: Secondary | ICD-10-CM | POA: Diagnosis not present

## 2024-01-25 MED ORDER — EZETIMIBE 10 MG PO TABS: 10.0000 mg | ORAL_TABLET | Freq: Every day | ORAL | 5 refills | Status: AC

## 2024-01-25 MED ORDER — DICLOFENAC SODIUM 1 % EX GEL
2.0000 g | Freq: Four times a day (QID) | CUTANEOUS | 1 refills | Status: AC | PRN
Start: 2024-01-25 — End: ?

## 2024-01-25 NOTE — Progress Notes (Unsigned)
    SUBJECTIVE:   CHIEF COMPLAINT / HPI:   T2DM -Current regimen: Metformin  500 twice daily -Checks blood sugars twice a day -Recent sugars have been low to mid 100s -Reports no recent low blood sugar readings -Reports no dizziness or presyncope   L knee swelling  -Started over weekend -Gets stiff if sit for long time  -Mild pain when walking on it, has been able to ambulate -No recent fall or trauma  -No redness or recent fevers   PERTINENT  PMH / PSH: T2DM, HLD, AUD  OBJECTIVE:   BP 132/69   Pulse 86   Wt 181 lb (82.1 kg)   SpO2 99%   BMI 29.21 kg/m   General: Well-appearing. Resting comfortably in room. CV: Normal S1/S2. No extra heart sounds. Warm and well-perfused. Pulm: Breathing comfortably on room air. CTAB. No increased WOB. Abd: Soft, non-tender, non-distended. Skin:  Warm, dry. Ext: Mild edema surrounding left patellar region, no increased redness, mild tenderness, no crepitus. Negative anterior and posterior drawer test on the left.  Negative Thessaly test bilaterally.  No posterior knee masses bilaterally.  Ambulates independently. Psych: Pleasant and appropriate.    ASSESSMENT/PLAN:   Assessment & Plan Type 2 diabetes mellitus without complication, without long-term current use of insulin  (HCC) Last A1c 7.6 in September. - Continue metformin  500 twice daily - Urine microalbumin today - Plan for next A1c check in 1-2 months Swelling of left knee History and exam with low concern for acute bony changes.  Suspect soft tissue swelling from overuse or mild strain versus osteoarthritis. - Voltaren gel 4 times daily - Lidocaine  patch, Tylenol  as needed  - Return precautions discussed   RTC in 1-2 months for follow-up.  Damien Cassis, MD [facility]

## 2024-01-25 NOTE — Patient Instructions (Addendum)
 Thank you for visiting clinic today and allowing us  to participate in your care!  Today we collected a urine sample to check in on the kidney function and we will let you know the results.  Please continue taking all of your medications as prescribed.  You may have some soft tissue swelling of your knee.  Thankfully your exam was overall reassuring is any broken bone. Please use the voltaren gel up to 4 times a day on the area to help with swelling and pain. You may also take some tylenol  or use lidocaine  patches in the area as needed. If your knee worsens or you have any other concerns, please come back to see us .   Please schedule an appointment in 1-2 months for follow-up.  Reach out any time with any questions or concerns you may have - we are here for you!  Damien Cassis, MD [facility] [phone]

## 2024-01-26 LAB — MICROALBUMIN / CREATININE URINE RATIO
Creatinine, Urine: 91.3 mg/dL
Microalb/Creat Ratio: <3 mg/g{creat} (ref 0–29)
Microalbumin, Urine: <3 ug/mL

## 2024-01-26 NOTE — Assessment & Plan Note (Signed)
 Last A1c 7.6 in September. - Continue metformin  500 twice daily - Urine microalbumin today - Plan for next A1c check in 1-2 months

## 2024-01-27 ENCOUNTER — Ambulatory Visit: Payer: Self-pay | Admitting: Family Medicine

## 2024-01-27 NOTE — Progress Notes (Signed)
 Linda Livingston                                          MRN: [id number]   01/27/2024   The VBCI Quality Team Specialist reviewed this patient medical record for the purposes of chart review for care gap closure. The following were reviewed: abstraction for care gap closure-kidney health evaluation for diabetes:eGFR  and uACR.    VBCI Quality Team

## 2024-01-27 NOTE — Progress Notes (Signed)
 Linda Livingston                                          MRN: [id number]   01/27/2024   The VBCI Quality Team Specialist reviewed this patient medical record for the purposes of chart review for care gap closure. The following were reviewed: chart review for care gap closure-colorectal cancer screening.    VBCI Quality Team

## 2024-02-03 NOTE — Progress Notes (Signed)
 Linda Livingston                                          MRN: [id number]   02/03/2024   The VBCI Quality Team Specialist reviewed this patient medical record for the purposes of chart review for care gap closure. The following were reviewed: chart review for care gap closure-glycemic status assessment.    VBCI Quality Team

## 2024-02-22 ENCOUNTER — Ambulatory Visit: Admitting: Family Medicine

## 2024-03-07 ENCOUNTER — Ambulatory Visit: Admitting: Family Medicine

## 2024-03-07 VITALS — BP 148/68 | HR 84 | Wt 187.4 lb

## 2024-03-07 DIAGNOSIS — E119 Type 2 diabetes mellitus without complications: Secondary | ICD-10-CM | POA: Diagnosis not present

## 2024-03-07 LAB — POCT GLYCOSYLATED HEMOGLOBIN (HGB A1C): HbA1c, POC (controlled diabetic range): 7.5 % — AB (ref 0.0–7.0)

## 2024-03-07 NOTE — Progress Notes (Unsigned)
 Patient presenting today for A1c check. Has no acute concerns. Believed she was here for lab appointment. A1c collected. SBP found to be elevated, patient asymptomatic, continue current anti-hypertensive regimen and fu at next clinic visit.  Damien Cassis, MD [facility]

## 2024-04-12 ENCOUNTER — Encounter: Payer: Self-pay | Admitting: Podiatry

## 2024-04-12 ENCOUNTER — Ambulatory Visit: Admitting: Podiatry

## 2024-04-12 DIAGNOSIS — Q828 Other specified congenital malformations of skin: Secondary | ICD-10-CM

## 2024-04-12 DIAGNOSIS — E1142 Type 2 diabetes mellitus with diabetic polyneuropathy: Secondary | ICD-10-CM

## 2024-04-12 NOTE — Progress Notes (Unsigned)
"  °  Subjective:  Patient ID: Linda Livingston, female    DOB: 1965/06/29,  MRN: [id number]  Linda Livingston presents to clinic today for {jgcomplaint:23593}  Chief Complaint  Patient presents with   Diabetes    Trim my calluses. Saw Dr. Damien Cassis - 03/07/2024; A1c - 7.5   New problem(s): None. {jgcomplaint:23593}  PCP is Cassis Damien, MD.  Allergies[1]  Review of Systems: Negative except as noted in the HPI.  Objective: No changes noted in today's physical examination. There were no vitals filed for this visit. Linda Livingston is a pleasant 59 y.o. female {jgbodyhabitus:24098} AAO x 3.  Vascular Examination: Capillary refill time immediate b/l. Palpable pedal pulses. Pedal hair present b/l. Pedal edema absent. No pain with calf compression b/l. Skin temperature gradient WNL b/l. No cyanosis or clubbing b/l. No ischemia or gangrene noted b/l.   Neurological Examination: Sensation grossly intact b/l with 10 gram monofilament. Vibratory sensation intact b/l. Pt has subjective symptoms of neuropathy.  Dermatological Examination: Pedal skin with normal turgor, texture and tone b/l.  No open wounds. No interdigital macerations.   Toenails recently debrided.    Porokeratotic lesion(s) medial IPJ of left great toe, medial IPJ of right great toe, submet head 2 right foot, submet head 5 right foot, and 1st metatarsal head b/l lower extremities. No erythema, no edema, no drainage, no fluctuance.  Musculoskeletal Examination: Muscle strength 5/5 to all lower extremity muscle groups bilaterally. Pes planus deformity noted bilateral LE. Patient ambulates independent of any assistive aids.  Radiographs: None  Assessment/Plan: 1. Porokeratosis   2. Diabetic peripheral neuropathy associated with type 2 diabetes mellitus (HCC)     No orders of the defined types were placed in this encounter.   None   Patient was evaluated and treated. All patient's and/or POA's questions/concerns  addressed on today's visit. Toenails 1-5 b/l debrided in length and girth without incident. Porokeratotic lesion(s) {jgPodToeLocator:23637} pared with sharp debridement without incident. Continue daily foot inspections and monitor blood glucose per PCP/Endocrinologist's recommendations. Continue soft, supportive shoe gear daily. Report any pedal injuries to medical professional. Call office if there are any questions/concerns. -Patient/POA to call should there be question/concern in the interim. No follow-ups on file.  Delon LITTIE Merlin, DPM      [facility] LOCATION: [address] [place] [address]                   Office [phone]   [facility] LOCATION: [address] [place], [place] [address] Office [phone]     [1] No Known Allergies  "

## 2024-04-13 ENCOUNTER — Encounter: Payer: Self-pay | Admitting: Podiatry

## 2024-07-13 ENCOUNTER — Ambulatory Visit: Admitting: Podiatry
